# Patient Record
Sex: Female | Born: 1950 | Race: White | Hispanic: No | Marital: Married
Health system: Northeastern US, Academic
[De-identification: ages and names within clinical notes are randomized; demographics above are authoritative.]

---

## 2019-12-07 ENCOUNTER — Ambulatory Visit
Admit: 2019-12-07 | Payer: PRIVATE HEALTH INSURANCE | Attending: Student in an Organized Health Care Education/Training Program | Primary: Internal Medicine

## 2019-12-08 DIAGNOSIS — Z23 Encounter for immunization: Secondary | ICD-10-CM

## 2019-12-26 MED ORDER — AMLODIPINE 5 MG TABLET
5 | ORAL_TABLET | ORAL | 1 refills | 90.00000 days | Status: AC
Start: 2019-12-26 — End: 2020-04-17

## 2019-12-30 ENCOUNTER — Inpatient Hospital Stay: Admit: 2019-12-30 | Discharge: 2019-12-30 | Payer: PRIVATE HEALTH INSURANCE | Primary: Internal Medicine

## 2019-12-30 DIAGNOSIS — Z0000 Encounter for general adult medical examination without abnormal findings: Secondary | ICD-10-CM

## 2019-12-30 DIAGNOSIS — M810 Age-related osteoporosis without current pathological fracture: Secondary | ICD-10-CM

## 2019-12-30 LAB — COMPREHENSIVE METABOLIC PANEL
BKR A/G RATIO: 1.9 (ref 1.0–2.2)
BKR ALANINE AMINOTRANSFERASE (ALT): 20 U/L (ref 10–35)
BKR ALBUMIN: 4.6 g/dL (ref 3.6–4.9)
BKR ALKALINE PHOSPHATASE: 47 U/L (ref 9–122)
BKR ANION GAP: 10 (ref 7–17)
BKR ASPARTATE AMINOTRANSFERASE (AST): 24 U/L (ref 10–35)
BKR BILIRUBIN TOTAL: 0.4 mg/dL (ref <=1.2)
BKR BLOOD UREA NITROGEN: 11 mg/dL (ref 8–23)
BKR BUN / CREAT RATIO: 17.2 (ref 8.0–23.0)
BKR CALCIUM: 9.1 mg/dL (ref 8.8–10.2)
BKR CHLORIDE: 102 mmol/L (ref 98–107)
BKR CO2: 26 mmol/L (ref 20–30)
BKR CREATININE: 0.64 mg/dL (ref 0.40–1.30)
BKR EGFR (AFR AMER): >60 mL/min/{1.73_m2} (ref >60)
BKR EGFR (NON AFRICAN AMERICAN): >60 mL/min/{1.73_m2} (ref >60)
BKR GLOBULIN: 2.4 g/dL
BKR GLUCOSE: 96 mg/dL (ref 70–100)
BKR POTASSIUM: 3.9 mmol/L (ref 3.3–5.1)
BKR PROTEIN TOTAL: 7 g/dL (ref 6.6–8.7)
BKR SODIUM: 138 mmol/L (ref 136–144)

## 2019-12-30 LAB — URINALYSIS WITH CULTURE REFLEX      (BH LMW YH)
BKR BILIRUBIN, UA: NEGATIVE
BKR BLOOD, UA: NEGATIVE
BKR GLUCOSE, UA: NEGATIVE
BKR KETONES, UA: NEGATIVE
BKR LEUKOCYTE ESTERASE, UA: NEGATIVE
BKR NITRITE, UA: NEGATIVE
BKR PH, UA: 7 (ref 5.5–7.5)
BKR PROTEIN, UA: NEGATIVE
BKR SPECIFIC GRAVITY, UA: 1.007 (ref 1.005–1.030)
BKR UROBILINOGEN, UA: <2 EU/dL (ref <=2.0)

## 2019-12-30 LAB — CBC WITH AUTO DIFFERENTIAL
BKR WAM ABSOLUTE IMMATURE GRANULOCYTES: 0 x 1000/ÂµL (ref 0.0–0.4)
BKR WAM ABSOLUTE LYMPHOCYTE COUNT: 1.8 x 1000/ÂµL (ref 0.5–5.4)
BKR WAM ABSOLUTE NRBC: 0 x 1000/ÂµL
BKR WAM ANALYZER ANC: 3.6 x 1000/ÂµL (ref 2.2–7.2)
BKR WAM BASOPHIL ABSOLUTE COUNT: 0.1 x 1000/ÂµL (ref 0.0–0.2)
BKR WAM BASOPHILS: 0.9 % (ref 0.0–2.0)
BKR WAM EOSINOPHIL ABSOLUTE COUNT: 0 x 1000/ÂµL (ref 0.0–0.4)
BKR WAM EOSINOPHILS: 0.7 % (ref 0.0–4.0)
BKR WAM HEMATOCRIT: 42.3 % (ref 36.0–48.0)
BKR WAM HEMOGLOBIN: 13.7 g/dL (ref 12.0–15.0)
BKR WAM IMMATURE GRANULOCYTES: 0.2 % (ref 0.0–0.4)
BKR WAM LYMPHOCYTES: 31 % (ref 10.0–50.0)
BKR WAM MCH (PG): 31.5 pg (ref 25.0–35.0)
BKR WAM MCHC: 32.4 g/dL — ABNORMAL LOW (ref 33.0–37.0)
BKR WAM MCV: 97.2 fL (ref 81.0–99.0)
BKR WAM MONOCYTE ABSOLUTE COUNT: 0.3 x 1000/ÂµL (ref 0.1–1.2)
BKR WAM MONOCYTES: 5.2 % (ref 3.0–11.0)
BKR WAM MPV: 10.6 fL (ref 8.0–12.0)
BKR WAM NEUTROPHILS: 62 % (ref 45.0–90.0)
BKR WAM NUCLEATED RED BLOOD CELLS: 0 % (ref 0.0–0.0)
BKR WAM PLATELETS: 238 x1000/ÂµL (ref 120–450)
BKR WAM RDW-CV: 13 % (ref 11.5–14.5)
BKR WAM RED BLOOD CELL COUNT: 4.4 M/ÂµL (ref 3.5–5.5)
BKR WAM WHITE BLOOD CELL COUNT: 5.8 x1000/ÂµL (ref 4.8–10.8)

## 2019-12-30 LAB — TSH W/REFLEX TO FT4     (BH GH LMW Q YH): BKR THYROID STIMULATING HORMONE: 1.05 u[IU]/mL

## 2019-12-30 LAB — LIPID PANEL
BKR CHOLESTEROL/HDL RATIO: 2.9 (ref 0.0–5.0)
BKR CHOLESTEROL: 191 mg/dL
BKR HDL CHOLESTEROL: 67 mg/dL (ref >=40)
BKR LDL CHOLESTEROL CALCULATED: 111 mg/dL — ABNORMAL HIGH
BKR TRIGLYCERIDES: 64 mg/dL

## 2019-12-30 LAB — UA REFLEX CULTURE

## 2019-12-30 LAB — BILIRUBIN, DIRECT: BKR BILIRUBIN DIRECT: <0.2 mg/dL (ref <=0.3)

## 2019-12-31 LAB — HEMOGLOBIN A1C
BKR ESTIMATED AVERAGE GLUCOSE: 123 mg/dL
BKR HEMOGLOBIN A1C: 5.9 % — ABNORMAL HIGH (ref 4.0–5.6)

## 2019-12-31 LAB — VITAMIN D, 25-HYDROXY: BKR VITAMIN D 25-HYDROXY: 61.5 ng/mL (ref 30.0–100.0)

## 2020-01-04 ENCOUNTER — Ambulatory Visit: Admit: 2020-01-04 | Payer: PRIVATE HEALTH INSURANCE | Primary: Internal Medicine

## 2020-01-04 DIAGNOSIS — Z23 Encounter for immunization: Secondary | ICD-10-CM

## 2020-04-17 MED ORDER — AMLODIPINE 5 MG TABLET
5 | ORAL_TABLET | ORAL | 1 refills | 90.00000 days | Status: AC
Start: 2020-04-17 — End: 2020-07-13

## 2020-07-07 MED ORDER — OMEPRAZOLE 40 MG CAPSULE,DELAYED RELEASE
40 | ORAL_CAPSULE | Freq: Every day | ORAL | 4 refills | 90.00000 days | Status: AC
Start: 2020-07-07 — End: 2020-07-30

## 2020-07-13 MED ORDER — AMLODIPINE 5 MG TABLET
5 | ORAL_TABLET | ORAL | 2 refills | 90.00000 days | Status: AC
Start: 2020-07-13 — End: 2021-01-05

## 2020-07-30 MED ORDER — OMEPRAZOLE 40 MG CAPSULE,DELAYED RELEASE
40 | ORAL_CAPSULE | ORAL | 4 refills | 90.00000 days | Status: AC
Start: 2020-07-30 — End: 2021-01-11

## 2020-11-09 ENCOUNTER — Encounter
Admit: 2020-11-09 | Payer: PRIVATE HEALTH INSURANCE | Attending: Vascular and Interventional Radiology | Primary: Internal Medicine

## 2020-11-23 MED ORDER — ZOLEDRONIC ACID 5 MG/100 ML IN MANNITOL 5 %-WATER INTRAVENOUS PIGGYBCK
5 | Freq: Once | INTRAVENOUS | Status: CP
Start: 2020-11-23 — End: ?
  Administered 2020-11-23: 16:00:00 5 mL/h via INTRAVENOUS

## 2021-01-05 MED ORDER — AMLODIPINE 5 MG TABLET
5 | ORAL_TABLET | ORAL | 1 refills | 90.00000 days | Status: AC
Start: 2021-01-05 — End: 2021-04-02

## 2021-01-11 ENCOUNTER — Inpatient Hospital Stay: Admit: 2021-01-11 | Discharge: 2021-01-11 | Payer: PRIVATE HEALTH INSURANCE | Primary: Internal Medicine

## 2021-01-11 DIAGNOSIS — Z0000 Encounter for general adult medical examination without abnormal findings: Secondary | ICD-10-CM

## 2021-01-11 DIAGNOSIS — R7303 Prediabetes: Secondary | ICD-10-CM

## 2021-01-11 LAB — COMPREHENSIVE METABOLIC PANEL
BKR A/G RATIO: 2.3 — ABNORMAL HIGH (ref 1.0–2.2)
BKR ALANINE AMINOTRANSFERASE (ALT): 24 U/L (ref 10–35)
BKR ALBUMIN: 4.8 g/dL (ref 3.6–4.9)
BKR ALKALINE PHOSPHATASE: 61 U/L (ref 9–122)
BKR ANION GAP: 10 g/dL (ref 7–17)
BKR ASPARTATE AMINOTRANSFERASE (AST): 23 U/L (ref 10–35)
BKR AST/ALT RATIO: 1 x 1000/??L (ref 0.00–1.00)
BKR BILIRUBIN TOTAL: 0.2 mg/dL (ref <=1.2)
BKR BLOOD UREA NITROGEN: 17 mg/dL (ref 8–23)
BKR BUN / CREAT RATIO: 24.6 — ABNORMAL HIGH (ref 8.0–23.0)
BKR CALCIUM: 9.4 mg/dL (ref 8.8–10.2)
BKR CHLORIDE: 104 mmol/L (ref 98–107)
BKR CO2: 27 mmol/L (ref 20–30)
BKR COLOR, UA: 98 mg/dL (ref 70–100)
BKR CREATININE: 0.69 mg/dL (ref 0.40–1.30)
BKR EGFR (AFR AMER): >60 mL/min/{1.73_m2} (ref >60)
BKR EGFR (NON AFRICAN AMERICAN): >60 mL/min/{1.73_m2} (ref >60)
BKR GLOBULIN: 2.1 g/dL (ref 0.60–3.70)
BKR GLUCOSE: 98 mg/dL (ref 70–100)
BKR POTASSIUM: 4.5 mmol/L (ref 3.3–5.3)
BKR PROTEIN TOTAL: 6.9 g/dL (ref 6.6–8.7)
BKR SODIUM: 141 mmol/L (ref 136–144)

## 2021-01-11 LAB — CBC WITH AUTO DIFFERENTIAL
BKR WAM ABSOLUTE IMMATURE GRANULOCYTES.: 0.01 x 1000/??L (ref 0.00–0.30)
BKR WAM ABSOLUTE LYMPHOCYTE COUNT.: 1.46 x 1000/??L (ref 0.60–3.70)
BKR WAM ABSOLUTE NRBC (2 DEC): 0 x 1000/??L (ref 0.00–1.00)
BKR WAM ANALYZER ANC: 5.5 x 1000/??L (ref 2.00–7.60)
BKR WAM BASOPHIL ABSOLUTE COUNT.: 0.05 x 1000/??L (ref 0.00–1.00)
BKR WAM BASOPHILS: 0.7 % (ref 0.0–1.4)
BKR WAM EOSINOPHIL ABSOLUTE COUNT.: 0.11 x 1000/??L (ref 0.00–1.00)
BKR WAM EOSINOPHILS: 1.5 % (ref 0.0–5.0)
BKR WAM HEMATOCRIT (2 DEC): 44.4 % (ref 35.00–45.00)
BKR WAM HEMOGLOBIN: 14.2 g/dL (ref 11.7–15.5)
BKR WAM IMMATURE GRANULOCYTES: 0.1 % (ref 0.0–1.0)
BKR WAM LYMPHOCYTES: 19.4 % — ABNORMAL HIGH (ref 17.0–50.0)
BKR WAM MCH (PG): 30.5 pg (ref 27.0–33.0)
BKR WAM MCHC: 32 g/dL (ref 31.0–36.0)
BKR WAM MCV: 95.5 fL (ref 80.0–100.0)
BKR WAM MONOCYTE ABSOLUTE COUNT.: 0.38 x 1000/??L — ABNORMAL HIGH (ref 0.00–1.00)
BKR WAM MONOCYTES: 5.1 % (ref 4.0–12.0)
BKR WAM MPV: 10.5 fL (ref 8.0–12.0)
BKR WAM NEUTROPHILS: 73.2 % — ABNORMAL HIGH (ref 39.0–72.0)
BKR WAM NUCLEATED RED BLOOD CELLS: 0 % (ref 0.0–1.0)
BKR WAM PLATELETS: 259 x1000/??L (ref 150–420)
BKR WAM RDW-CV: 13.2 % (ref 11.0–15.0)
BKR WAM RED BLOOD CELL COUNT.: 4.65 M/??L (ref 4.00–6.00)
BKR WAM WHITE BLOOD CELL COUNT: 7.5 x1000/??L (ref 4.0–11.0)

## 2021-01-11 LAB — URINE MICROSCOPIC     (BH GH LMW YH)
BKR HYALINE CASTS, UA INSTRUMENT (NUMERIC): 0 /LPF (ref 0–3)
BKR RBC/HPF INSTRUMENT: 1 /HPF (ref 0–2)
BKR WBC/HPF INSTRUMENT: 0 /HPF (ref 0–5)

## 2021-01-11 LAB — URINALYSIS WITH CULTURE REFLEX      (BH LMW YH)
BKR BILIRUBIN DIRECT: 1.009 mg/dL (ref 1.005–1.030)
BKR BILIRUBIN, UA: NEGATIVE
BKR GLUCOSE, UA: NEGATIVE
BKR KETONES, UA: NEGATIVE
BKR LEUKOCYTE ESTERASE, UA: NEGATIVE
BKR NITRITE, UA: NEGATIVE
BKR PH, UA: 6 mg/dL (ref 5.5–7.5)
BKR PROTEIN, UA: NEGATIVE
BKR SPECIFIC GRAVITY, UA: 1.009 (ref 1.005–1.030)
BKR UROBILINOGEN, UA: <2 EU/dL (ref <=2.0)

## 2021-01-11 LAB — UA REFLEX CULTURE

## 2021-01-11 LAB — LIPID PANEL
BKR CHOLESTEROL/HDL RATIO: 3.4 (ref 0.0–5.0)
BKR CHOLESTEROL: 212 mg/dL — ABNORMAL HIGH
BKR HDL CHOLESTEROL: 62 mg/dL (ref >=40)
BKR LDL CHOLESTEROL CALCULATED: 127 mg/dL — ABNORMAL HIGH
BKR TRIGLYCERIDES: 113 mg/dL

## 2021-01-11 LAB — TSH W/REFLEX TO FT4     (BH GH LMW Q YH): BKR THYROID STIMULATING HORMONE: 1.35 ??IU/mL

## 2021-01-12 LAB — HEMOGLOBIN A1C
BKR ESTIMATED AVERAGE GLUCOSE: 123 mg/dL
BKR HEMOGLOBIN A1C: 5.9 % — ABNORMAL HIGH (ref 4.0–5.6)

## 2021-04-02 MED ORDER — AMLODIPINE 5 MG TABLET
5 | ORAL_TABLET | ORAL | 2 refills | 90.00000 days | Status: AC
Start: 2021-04-02 — End: 2021-09-29

## 2021-05-28 IMAGING — CT TC TORAX
2 series · 11 of 33 positions shown, 12 images · non-contrast
Comparison: none

[Series 3: mediastino body 2.0 · axial · 0.78mm/px · z∈[-993,-716]mm · 8 of 331 slices shown]
[im 27/331  mediastinal]
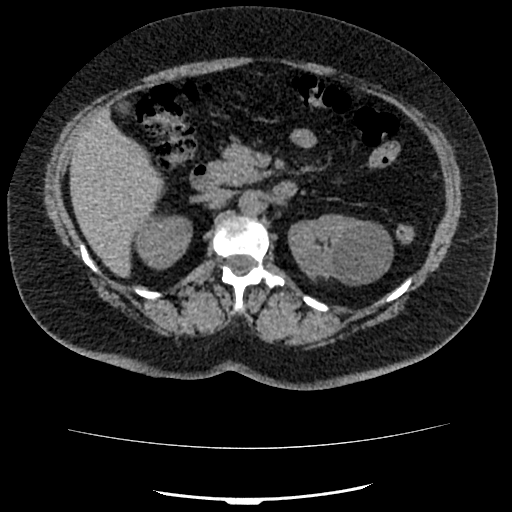
[im 80/331  mediastinal]
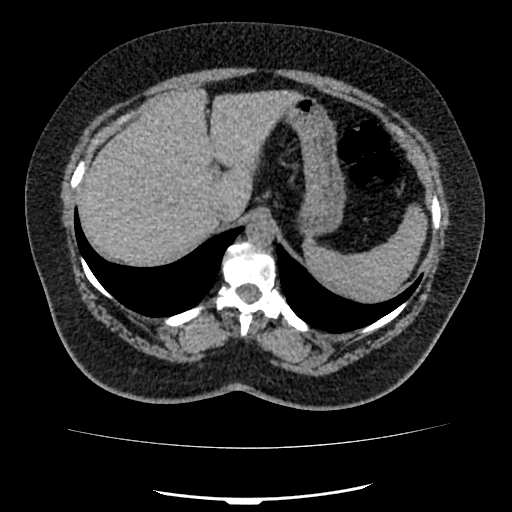
[im 106/331  mediastinal]
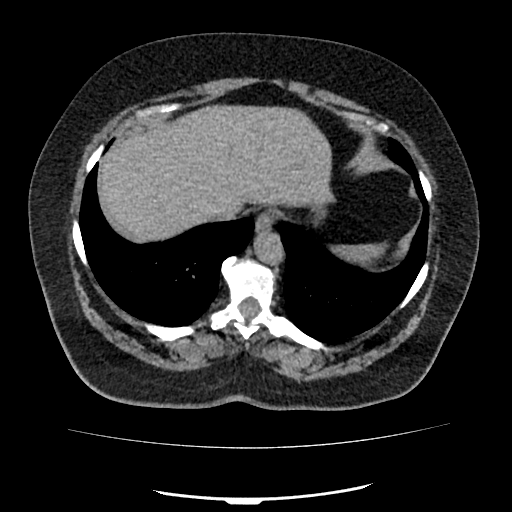
[im 159/331  mediastinal]
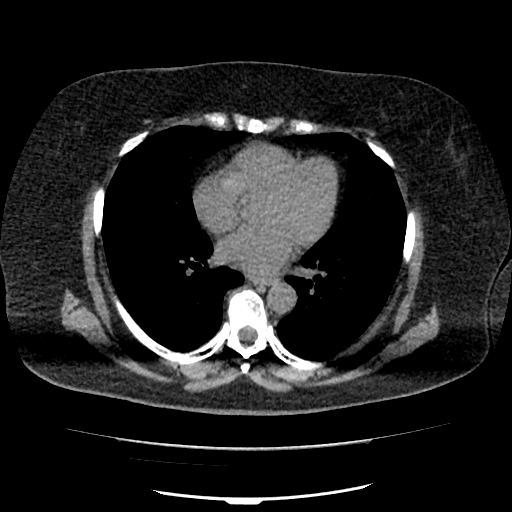
[im 174/331  mediastinal]
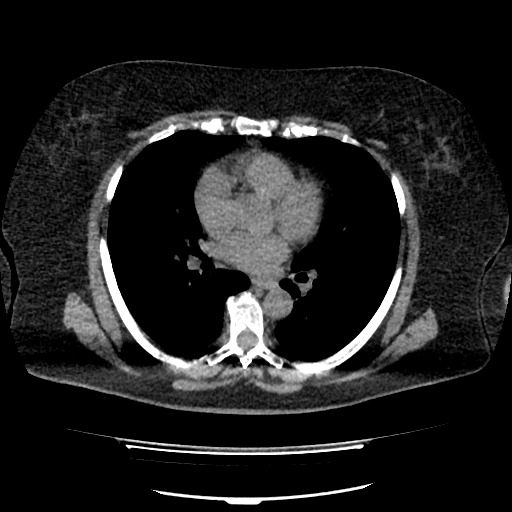
[im 225/331  mediastinal]
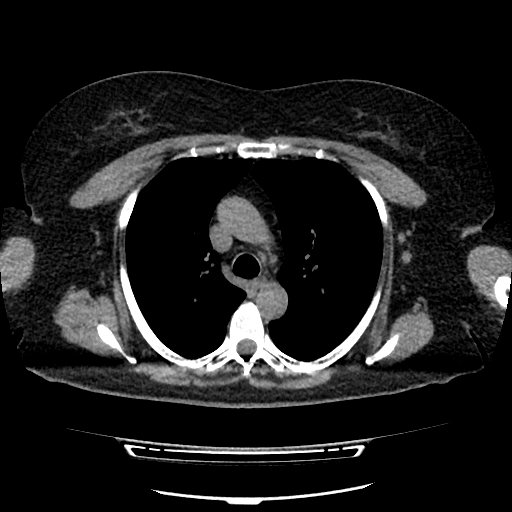
[im 251/331  mediastinal]
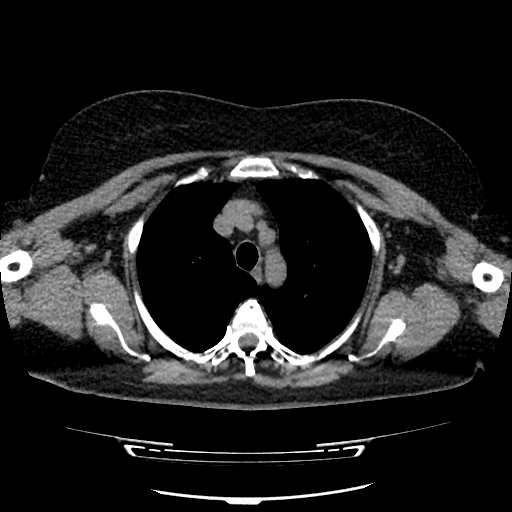
[im 304/331  mediastinal]
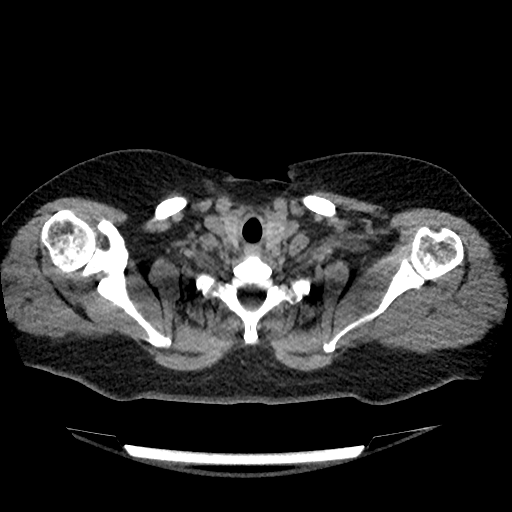

[Series 5: mediastino body 0.784 · axial · 0.78mm/px · z∈[-997,-654]mm · 3 of 21 slices shown, 4 images]
[im 1/21  mediastinal]
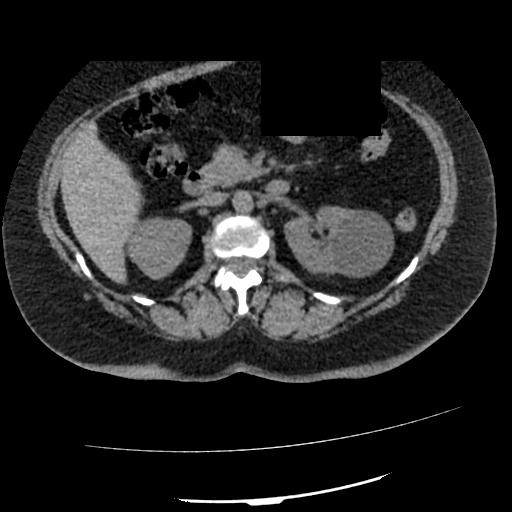
[im 1/21  lung]
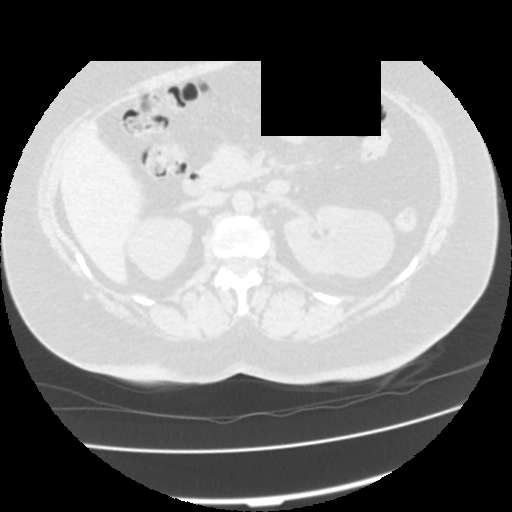
[im 12/21  lung]
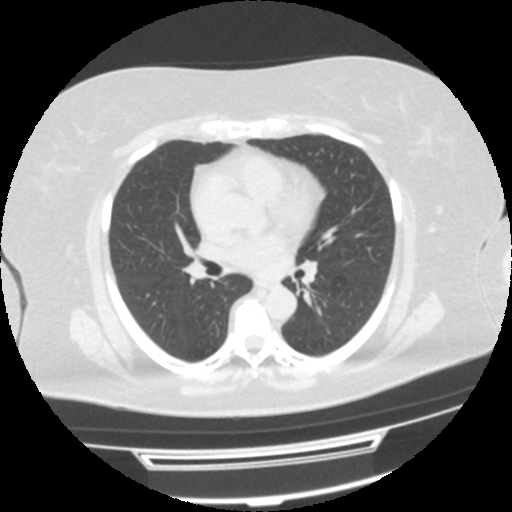
[im 21/21  lung]
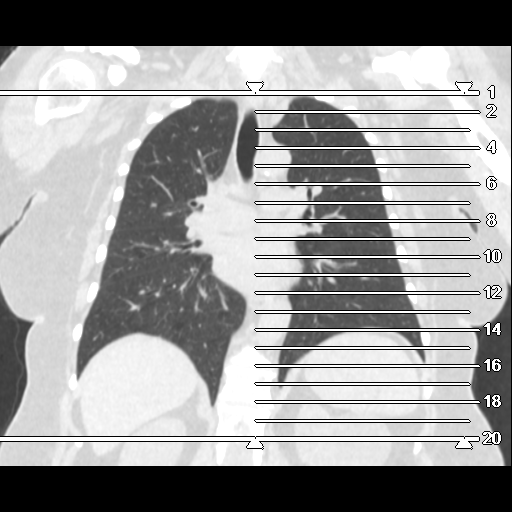

[11 of 33 positions shown; findings below may reference images not displayed]

Técnica:
Aquisição volumétrica com reconstruções multiplanares.

Relatório:
Bandas parenquimatosas basais de aspecto fibroatelectásico.
TOMOGRAFIA COMPUTADORIZADA DO TÓRAX
Restante do parênquima pulmonar com atenuação preservada.
Traqueia e brônquios principais patentes, de calibres normais.
Estruturas vasculares mediastinais com calibre preservado.
Linfonodos calcificados hilares à esquerda, de aspecto sequelar.
Linfonodomegalia subcarinal, medindo 1,3 cm, de aspecto inespecífico.
Ausência de derrame pleural.
Alterações degenerativas na coluna torácica.

Nódulo hipoatenuante no terço inferior do lobo esquerdo da tireoide, medindo 2,3 cm, de aspecto 
inespecífico. Correlacionar com estudo ecográfico.

Impressão:
Bandas parenquimatosas basais de aspecto fibroatelectásico.
Linfonodos calcificados hilares à esquerda, de aspecto sequelar.
Linfonodomegalia subcarinal, de aspecto inespecífico.
Alterações degenerativas na coluna torácica.
Nódulo hipoatenuante  no  terço  inferior  do  lobo  esquerdo  da  tireoide,  de  aspecto  inespecífico. 
Correlacionar com estudo ecográfico.

## 2021-09-29 MED ORDER — AMLODIPINE 5 MG TABLET
5 | ORAL_TABLET | ORAL | 2 refills | 90.00000 days | Status: AC
Start: 2021-09-29 — End: 2022-05-12

## 2021-10-01 ENCOUNTER — Emergency Department: Admit: 2021-10-01 | Payer: PRIVATE HEALTH INSURANCE | Primary: Internal Medicine

## 2021-10-01 ENCOUNTER — Ambulatory Visit: Admit: 2021-10-01 | Payer: PRIVATE HEALTH INSURANCE | Primary: Internal Medicine

## 2021-10-01 ENCOUNTER — Inpatient Hospital Stay: Admit: 2021-10-01 | Discharge: 2021-10-02 | Payer: PRIVATE HEALTH INSURANCE | Primary: Internal Medicine

## 2021-10-01 ENCOUNTER — Encounter: Admit: 2021-10-01 | Payer: PRIVATE HEALTH INSURANCE | Primary: Internal Medicine

## 2021-10-01 DIAGNOSIS — R110 Nausea: Secondary | ICD-10-CM

## 2021-10-01 DIAGNOSIS — H538 Other visual disturbances: Secondary | ICD-10-CM

## 2021-10-01 DIAGNOSIS — R42 Dizziness and giddiness: Principal | ICD-10-CM

## 2021-10-01 LAB — COMPREHENSIVE METABOLIC PANEL
BKR A/G RATIO: 2 (ref 1.0–2.2)
BKR ALANINE AMINOTRANSFERASE (ALT): 19 U/L (ref 10–35)
BKR ALBUMIN: 4.2 g/dL (ref 3.6–4.9)
BKR ALKALINE PHOSPHATASE: 48 U/L (ref 9–122)
BKR ANION GAP: 9 (ref 7–17)
BKR ASPARTATE AMINOTRANSFERASE (AST): 18 U/L (ref 10–35)
BKR AST/ALT RATIO: 0.9
BKR BILIRUBIN TOTAL: 0.4 mg/dL (ref <=1.2)
BKR BLOOD UREA NITROGEN: 22 mg/dL (ref 8–23)
BKR BUN / CREAT RATIO: 28.6 — ABNORMAL HIGH (ref 8.0–23.0)
BKR CALCIUM: 9.4 mg/dL (ref 8.8–10.2)
BKR CHLORIDE: 96 mmol/L — ABNORMAL LOW (ref 98–107)
BKR CO2: 28 mmol/L (ref 20–30)
BKR CREATININE: 0.77 mg/dL (ref 0.40–1.30)
BKR EGFR, CREATININE (CKD-EPI 2021): >60 mL/min/{1.73_m2} (ref >=60)
BKR GLOBULIN: 2.1 g/dL — ABNORMAL LOW (ref 2.3–3.5)
BKR GLUCOSE: 163 mg/dL — ABNORMAL HIGH (ref 70–100)
BKR POTASSIUM: 3.6 mmol/L (ref 3.3–5.3)
BKR PROTEIN TOTAL: 6.3 g/dL — ABNORMAL LOW (ref 6.6–8.7)
BKR SODIUM: 133 mmol/L — ABNORMAL LOW (ref 136–144)

## 2021-10-01 LAB — CBC WITHOUT DIFFERENTIAL
BKR WAM ANALYZER ANC: 4.59 x 1000/ÂµL (ref 2.00–7.60)
BKR WAM HEMATOCRIT (2 DEC): 38.8 % (ref 35.00–45.00)
BKR WAM HEMOGLOBIN: 12.8 g/dL (ref 11.7–15.5)
BKR WAM MCH (PG): 31.4 pg (ref 27.0–33.0)
BKR WAM MCHC: 33 g/dL (ref 31.0–36.0)
BKR WAM MCV: 95.1 fL (ref 80.0–100.0)
BKR WAM MPV: 10.7 fL (ref 8.0–12.0)
BKR WAM PLATELETS: 215 x1000/ÂµL (ref 150–420)
BKR WAM RDW-CV: 12.9 % (ref 11.0–15.0)
BKR WAM RED BLOOD CELL COUNT.: 4.08 M/ÂµL (ref 4.00–6.00)
BKR WAM WHITE BLOOD CELL COUNT: 6.2 x1000/ÂµL (ref 4.0–11.0)

## 2021-10-01 LAB — INFLUENZA A+B/RSV BY RT-PCR
BKR INFLUENZA A: NEGATIVE
BKR INFLUENZA B: NEGATIVE
BKR RESPIRATORY SYNCYTIAL VIRUS: NEGATIVE

## 2021-10-01 LAB — PROTIME AND INR
BKR INR: 1.05 (ref 0.92–1.08)
BKR PROTHROMBIN TIME: 10.9 s (ref 9.5–12.1)

## 2021-10-01 LAB — TROPONIN T HIGH SENSITIVITY, 1 HOUR WITH REFLEX (BH GH LMW YH)
BKR TROPONIN T HS 1 HOUR DELTA FROM 0 HOUR: 1 ng/L
BKR TROPONIN T HS 1 HOUR: 7 ng/L

## 2021-10-01 LAB — MAGNESIUM: BKR MAGNESIUM: 1.6 mg/dL — ABNORMAL LOW (ref 1.7–2.4)

## 2021-10-01 LAB — PARTIAL THROMBOPLASTIN TIME     (BH GH LMW Q YH): BKR PARTIAL THROMBOPLASTIN TIME: 21.8 s — ABNORMAL LOW (ref 23.0–32.1)

## 2021-10-01 LAB — TROPONIN T HIGH SENSITIVITY, 0 HOUR BASELINE WITH REFLEX (BH GH LMW YH): BKR TROPONIN T HS 0 HOUR BASELINE: <6 ng/L

## 2021-10-01 LAB — TSH W/REFLEX TO FT4     (BH GH LMW Q YH): BKR THYROID STIMULATING HORMONE: 0.623 u[IU]/mL

## 2021-10-01 LAB — SARS COV-2 (COVID-19) RNA: BKR SARS-COV-2 RNA (COVID-19) (YH): NEGATIVE

## 2021-10-01 MED ORDER — AMLODIPINE 5 MG TABLET
5 mg | Freq: Every day | ORAL | Status: DC
Start: 2021-10-01 — End: 2021-10-02
  Administered 2021-10-02: 15:00:00 5 mg via ORAL

## 2021-10-01 MED ORDER — ONDANSETRON HCL (PF) 4 MG/2 ML INJECTION SOLUTION
4 mg/2 mL | Freq: Once | INTRAVENOUS | Status: CP
Start: 2021-10-01 — End: ?
  Administered 2021-10-01: 20:00:00 4 mL via INTRAVENOUS

## 2021-10-01 MED ORDER — SODIUM CHLORIDE 0.9 % (FLUSH) INJECTION SYRINGE
0.9 % | INTRAVENOUS | Status: DC | PRN
Start: 2021-10-01 — End: 2021-10-02

## 2021-10-01 MED ORDER — ROSUVASTATIN 40 MG TABLET
40 mg | Freq: Every day | ORAL | Status: DC
Start: 2021-10-01 — End: 2021-10-02
  Administered 2021-10-02: 15:00:00 40 mg via ORAL

## 2021-10-01 MED ORDER — HEPARIN (PORCINE) 5,000 UNIT/ML INJECTION SOLUTION
5000 unit/mL | Freq: Two times a day (BID) | SUBCUTANEOUS | Status: DC
Start: 2021-10-01 — End: 2021-10-02
  Administered 2021-10-02: 03:00:00 via SUBCUTANEOUS

## 2021-10-01 MED ORDER — IOHEXOL 350 MG IODINE/ML INTRAVENOUS SOLUTION
350 mg iodine/mL | Freq: Once | INTRAVENOUS | Status: CP | PRN
Start: 2021-10-01 — End: ?
  Administered 2021-10-01: 20:00:00 350 mL via INTRAVENOUS

## 2021-10-01 MED ORDER — SODIUM CHLORIDE 0.9 % (FLUSH) INJECTION SYRINGE
0.9 % | Freq: Three times a day (TID) | INTRAVENOUS | Status: DC
Start: 2021-10-01 — End: 2021-10-02
  Administered 2021-10-02 (×2): 0.9 mL via INTRAVENOUS

## 2021-10-01 NOTE — ED Notes
6:02 PM Assisting primary RN with care. MRI screener completed.

## 2021-10-01 NOTE — ED Notes
63:70 PM 70 year old female arrived from home c/o +stroke alert activated @ 2:21 PM from PIT, as +vertigo, +dizziness, +dysphagia, and blurry vision bilaterally. Last known well: 12PM today. Pt received IVF and Reglan prior to stroke activation per [facility] as pt was in PIT. Pt now awake and alert. Interior head completed. Pt moved to Trauma 4. MD Cronsell eval'd pt. IV in place to [facility], bloodwork completed and sent to lab per orders. SBP 140s, HR 80s NSR. Sats room air 99%. Blood glucose: 137. Denies chest pain, dyspnea or distress at this time. Telestroke MD [facility] eval'd pt, Bailey head negative, deficits are improving, NIH now 0. No need for Tenectaplase at this time. IV Zofran administered per [facility] for +nausea, improved s/p Zofran. Pt resting comfortably at this time. Awaiting ready room to move out of Trauma at this time. Spouse at bedside, updated on plan of care. Past Medical History: Diagnosis Date ? H/O colonoscopy 02/19/2018  70yr recall / serrated polyp ? History of peptic ulcer  ? Hypertension   EF > 55% 3/16 ? Osteoporosis   Dr. Yancey Flemings; 2/20 T -2.9; T -1.7 11/17; T -2.1 11/15 ? Prediabetes  ? Thyroid nodule   Dr. Yancey Flemings

## 2021-10-01 NOTE — H&P
[facility] H&PHistory provided by: the patientSubjective CC: weird sensationHPI: 70 y.o. female PMH of HTN, peptic ulcer who presents with abnormal sensation. Patient states around 12PM today she had sudden onset dizziness with word finding difficulties. Her symptoms did not resolve until she came to the hospital. Patient denies any similar events previously.ROS is otherwise negative for fever/ chills/ headache/ vision-change/ focal-weakness/ chest-pain/ dyspnea/ palpitations/ cough/ abd-pain/ nausea/ vomiting/ diarrhea/ constipation/ dysuria/ hematuria/ frequency, rash/ myalgias/ arthralgiasED: CTH, CTAObjective Medical History PMH PSH Past Medical History: Diagnosis Date ? H/O colonoscopy 02/19/2018  54yr recall / serrated polyp ? History of peptic ulcer  ? Hypertension   EF > 55% 3/16 ? Osteoporosis   Dr. Yancey Flemings; 2/20 T -2.9; T -1.7 11/17; T -2.1 11/15 ? Prediabetes  ? Thyroid nodule   Dr. Yancey Flemings  Past Surgical History: Procedure Laterality Date ? Arthroscopic surgery of the right knee   ? CATARACT EXTRACTION   ? Cesarean section (x1)   ? History of thyroid biopsy (had an FNA biopsy in 2008 and 2011, unremarkable)   ? SHOULDER SURGERY Right  ? TONSILLECTOMY    Social History Family History Social History Socioeconomic History ? Marital status: Married   Spouse name: Not on file ? Number of children: Not on file ? Years of education: Not on file ? Highest education level: Not on file Occupational History ? Not on file Tobacco Use ? Smoking status: Never ? Smokeless tobacco: Never Substance and Sexual Activity ? Alcohol use: Yes   Comment: occasional glass of wine ? Drug use: No ? Sexual activity: Not on file Other Topics Concern ? Not on file Social History Narrative ? Not on file Social Determinants of Health Financial Resource Strain: Not on file Food Insecurity: Not on file Transportation Needs: Not on file Physical Activity: Not on file Stress: Not on file Social Connections: Not on file Intimate Partner Violence: Not on file Housing Stability: Not on file  Family History Problem Relation Age of Onset ? Osteoporosis Mother  ? Other (data conversion) Cousin       Siblings (4) history of hyperlipidemia 1 brother with MS/daughter with MS/Family history of diabetes mellitus Sister with DM/Family history of hyperlipidemia strong family history/Father health status was reviewed died at 28, MI/Mother health status was reviewed Living with osteoporosis and hypercholesterolemia ? Coronary Artery Disease Father  ? Coronary Artery Disease Brother  ? Hemochromatosis Brother  ? Diabetes Sister   Prior to Admission Medications (Not in a hospital admission)  Allergies Allergies Allergen Reactions ? Vicodin [Hydrocodone-Acetaminophen] Syncope  Review of Systems Review of Systems: as per HPIObjective Physical Exam Vitals: BP 130/61  - Pulse 86  - Temp 97.2 ?F (36.2 ?C) (Temporal)  - Resp 19  - Wt 63.7 kg  - SpO2 98%  - BMI 28.36 kg/m? General: NADHEENT: NC/AT, PERRLACardiac: RRR, no M/R/G, S1S2Pulmonary: BLAE, no W/C/RAbdomen: soft, NT/ND, BS+, no rebound/guardingExtremities: symmetric, no edema, DP +2 b/lNeurologic: AAOx3, CN II-XII grossly intact, moving all 4 extremities, sensation intact, reflexes symmetricMSK: normal ROM, no bruising, no erythema, non tenderSkin: no rash or lesionsPertinent Labs/Diagnostics LabsRecent Results (from the past 24 hour(s)) EKG  Collection Time: 10/01/21  2:16 PM Result Value Ref Range  Heart Rate 90 bpm  QRS Interval 80 ms  QT Interval 394 ms  QTC Interval 481 ms  P Axis 67 deg  QRS Axis 19 deg  T Wave Axis 52 deg  P-R Interval 156 msec  SEVERITY Abnormal ECG severity Mag  Collection Time: 10/01/21 2:53 PM  Result Value Ref Range  Magnesium 1.6 (L) 1.7 - 2.4 mg/dL Comprehensive metabolic panel  Collection Time: 10/01/21  2:53 PM Result Value Ref Range  Sodium 133 (L) 136 - 144 mmol/L  Potassium 3.6 3.3 - 5.3 mmol/L  Chloride 96 (L) 98 - 107 mmol/L  CO2 28 20 - 30 mmol/L  Anion Gap 9 7 - 17  Glucose 163 (H) 70 - 100 mg/dL  BUN 22 8 - 23 mg/dL  Creatinine 7.84 6.96 - 1.30 mg/dL  Calcium 9.4 8.8 - 29.5 mg/dL  BUN/Creatinine Ratio 28.4 (H) 8.0 - 23.0  Total Protein 6.3 (L) 6.6 - 8.7 g/dL  Albumin 4.2 3.6 - 4.9 g/dL  Total Bilirubin 0.4 <=1.3 mg/dL  Alkaline Phosphatase 48 9 - 122 U/L  Alanine Aminotransferase (ALT) 19 10 - 35 U/L  Aspartate Aminotransferase (AST) 18 10 - 35 U/L  Globulin 2.1 (L) 2.3 - 3.5 g/dL  A/G Ratio 2.0 1.0 - 2.2  AST/ALT Ratio 0.9 See Comment  eGFR (Creatinine) >60 >=60 mL/min/1.18m2 CBC without differential  Collection Time: 10/01/21  2:53 PM Result Value Ref Range  WBC 6.2 4.0 - 11.0 x1000/?L  RBC 4.08 4.00 - 6.00 M/?L  Hemoglobin 12.8 11.7 - 15.5 g/dL  Hematocrit 24.40 10.27 - 45.00 %  MCV 95.1 80.0 - 100.0 fL  MCH 31.4 27.0 - 33.0 pg  MCHC 33.0 31.0 - 36.0 g/dL  RDW-CV 25.3 66.4 - 40.3 %  Platelets 215 150 - 420 x1000/?L  MPV 10.7 8.0 - 12.0 fL  ANC(Abs Neutrophil Count) 4.59 2.00 - 7.60 x 1000/?L Protime-INR  Collection Time: 10/01/21  2:53 PM Result Value Ref Range  Prothrombin Time 10.9 9.5 - 12.1 seconds  INR 1.05 0.92 - 1.08 Partial thromboplastin time  Collection Time: 10/01/21  2:53 PM Result Value Ref Range  PTT 21.8 (L) 23.0 - 32.1 seconds Type and screen  Collection Time: 10/01/21  2:53 PM Result Value Ref Range  ABO Grouping A   Rh Type POS   Antibody Screen NEG  Troponin T High Sensitivity, Emergency; 0 hour baseline AND 1 hour with reflex (3 hour)  Collection Time: 10/01/21  2:53 PM Result Value Ref Range  High Sensitivity Troponin T <6 See Comment ng/L SARS CoV-2 (COVID-19) RNA-[facility])  Collection Time: 10/01/21  2:53 PM  Specimen: Nasopharynx; Viral Result Value Ref Range  SARS-CoV-2 RNA (COVID-19)  Negative Negative Influenza A+B/RSV by RT-PCR ([facility])  Collection Time: 10/01/21  2:53 PM  Specimen: Nasopharynx; Viral Result Value Ref Range  Influenza A Negative Negative  Influenza B Negative Negative  Respiratory Syncytial Virus Negative Negative TSH w/reflex to FT4  Collection Time: 10/01/21  2:53 PM Result Value Ref Range  Thyroid Stimulating Hormone 0.623 See Comment ?IU/mL Troponin T High Sensitivity, 1 Hour With Reflex ([facility])  Collection Time: 10/01/21  3:57 PM Result Value Ref Range  High Sensitivity Troponin T 7 See Comment ng/L  1 hour Delta from 0 Hour, HS-Troponin T 1 ng/L ImagingCTA Head Neck Stroke Code W IV ContrastResult Date: 12/16/2022HISTORY:  Neuro deficit, acute, stroke suspected TECHNIQUE:  Routine HISTORY:  Neuro deficit, acute, stroke suspected TECHNIQUE:  Routine CTA head and neck. Reconstructions:  From the original axial multidetector data set, 3D images were created on an independent Vis[employer] workstation.  Iterative reconstructions was utilized to minimize dose. Contrast: 50 cc Omnipaque 350. COMPARISON:  None FINDINGS: CTA Head VASCULAR Anatomy:  No unusual variant or anomalous anatomy. Stenotic/Occlusive Disease: Calcific atherosclerosis  of the cavernous internal carotid arteries without hemodynamically significant stenosis Aneurysms:  None. Vascular Malformations:  None. CTA Neck Right Carotid:  No hemodynamically significant stenosis. Left Carotid:  No hemodynamically significant stenosis. Vertebrobasilar System:  Antegrade flow both vertebral arteries. Subclavian Arteries:  Unremarkable. Arch:  Unremarkable. Nonvascular Structures:  Enlarged thyroid gland with heterogenous nodule predominantly in the isthmus and left lobe measuring approximately 3.7 x 3.2 cm. Stenosis ranges are derived utilizing NASCET criteria.  No high-grade stenosis or occlusion of the cervical or large intracranial vasculature. Large left and isthmus thyroid nodule, by report this was previously biopsied in 2011. Above findings were communicated to and acknowledged by Cronsell,Jennifer,MD at 10/01/2021 2:56 PM. Reported and signed by:  Aliene Altes, MD [place] Head Stroke Code WO IV ContrastResult Date: 12/16/2022CLINICAL INFORMATION: Neuro deficit, acute, stroke suspected TECHNIQUE: [place] Brain without contrast.  Protocol: Routine.  Iterative reconstruction was utilized to minimize dose. COMPARISON:  None FINDINGS: Brain Parenchyma: No hemorrhage or acute territorial infarct. Small cortical calcification in left lateral frontal lobe, could be related to prior infection. Extra Axial Spaces:  There is a partially empty sella turcica. This is usually an incidental finding, although it may rarely be an indicator of intracranial hypertension. Ventricular System:  Normal size and configuration. Visualized Paranasal Sinuses/Mastoids:  Bilateral sphenoid sinuses mucosal thickening. Bone:  No acute or suspicious abnormalities.  No parenchymal hemorrhage or acute territorial infarct. Partially empty sella turcica, incidental finding, although it may rarely be an indicator of intracranial hypertension Above findings were communicated to and acknowledged by Joelyn Oms, MD at 10/01/2021 2:48 PM. Reported and signed by:  Aliene Altes, MD [facility] for orders placed or performed during the hospital encounter of 10/01/21 EKG Result Value Ref Range  Heart Rate 90 bpm  QRS Interval 80 ms  QT Interval 394 ms  QTC Interval 481 ms  P Axis 67 deg  QRS Axis 19 deg  T Wave Axis 52 deg  P-R Interval 156 msec  SEVERITY Abnormal ECG severity  Assessment/Plan 70 y.o. female PMH of HTN, peptic ulcer who presents with abnormal sensation.# Dysarthria# Dizziness-trop negative, CTH and CTA negative for actionable findings, incidental partial empty sella-PT/OT, MRI brain, neurochecks-cw norvas-teleDiet: regularDVT Prophylaxis: lovenox SQFull ACLSNotifications PCP: Marjean Donna    Plan discussed with patient and/or family. YesSignedDecember 16, 2022 5:22 PMAng Chipper Herb, DOHospitalist at Anthem HospitalMHB [phone]

## 2021-10-01 NOTE — Progress Notes
Stroke Nurse Navigator Note - Stroke CodePatientSkilynn Durney PereiraMRN: [id number] DOB: 1952/11/05Presenting symptoms: Dizziness (Sudden onset nausea and dizziness. Denies CP or sob. ) and Stroke Code (Dizziness onset 12pm, persistent, blurry vision)Admission diagnosis: No admission diagnoses are documented for this encounter. Medical History: Past Medical History: Diagnosis Date ? H/O colonoscopy 02/19/2018  64yr recall / serrated polyp ? History of peptic ulcer  ? Hypertension   EF > 55% 3/16 ? Osteoporosis   Dr. Yancey Flemings; 2/20 T -2.9; T -1.7 11/17; T -2.1 11/15 ? Prediabetes  ? Thyroid nodule   Dr. Yancey Flemings Preferred language: PortugueseEmergency contact:  Primary Emergency Contact: Kaestner,Agusto, Home Phone: [phone] Last Documented Assessment 1a. LOC 0-->Alert, keenly responsive (10/01/21 1458 : Dolores Patty, RN) 1b. LOC Questions 0-->Answers both questions correctly (10/01/21 1458 : Dolores Patty, RN) 1c. LOC Commands 0-->Performs both tasks correctly (10/01/21 1458 : Dolores Patty, RN) 2. Gaze 0-->Normal (10/01/21 1458 : Dolores Patty, RN) 3. Visual Fields 0-->No visual loss (10/01/21 1458 : Dolores Patty, RN) 4. Facial 0-->Normal symmetrical movements (10/01/21 1458 : Dolores Patty, RN) 5a. Motor LUE 0-->No drift, limb holds 90 (or 45) degrees for full 10 secs (10/01/21 1458 : Dolores Patty, RN) 5b. Motor RUE 0-->No drift, limb holds 90 (or 45) degrees for full 10 secs (10/01/21 1458 : Dolores Patty, RN) 6a. Motor LLE 0-->No drift, leg holds 30 degree position for full 5 secs (10/01/21 1458 : Dolores Patty, RN) 6b. Motor RLE 0-->No drift, leg holds 30 degree position for full 5 secs (10/01/21 1458 : Dolores Patty, RN) 7. Limb Ataxia 0-->Absent (10/01/21 1458 : Dolores Patty, RN) 8. Sensory 0-->Normal, no sensory loss (10/01/21 1458 : Dolores Patty, RN) 9. Best Language 0-->No aphasia, normal (10/01/21 1458 : Dolores Patty, RN) 10. Dysarthria 0-->Normal (10/01/21 1458 : Dolores Patty, RN) 11. Extinction/Inattention 0-->No abnormality (10/01/21 1458 : Dolores Patty, RN) Total 0 (10/01/21 1458 : Dolores Patty, RN) 3oz Swallow Eval: (S) PASS -uninterrupted drinking and without coughing/choking during or immediately after drinking (10/01/21 1508 : Dolores Patty, RN)Finger stick: 137Stroke Code Note:  Stroke Code initiated @ 1420CC: vertigo, blurred vision, left arm drift.Patient from triage to ED [place].  Evaluated by Dr. Loney Loh.  [place]/CTA completed patient moved to Trauma 4 for further evaluation. Patient from home via EMS. Patient CC of vertigo, nausea, started @ 12 noon today. Arrived to ED went for EKG and evaluation in front triage. Then to [place] following ED MD evaluation. No focal deficit noted on exam.  See NIH flow sheet for details.Tele Stroke. Dr. Corrinne Eagle online for exam. SNN will continue to follow the patient.Caroleen Hamman, BSN, RNStroke Nurse NavigatorMHB: [phone] Nurse Navigator is available Monday through Friday, from 8am-4:30pm. Please reach out on [facility] for assistance.

## 2021-10-01 NOTE — Utilization Review (ED)
vertigo, +dizziness, +dysphagia, and blurry vision bilaterallyDeficits improvingCTH negNIHSS 0 at this timezofran given for nauseaUM Status: Managed Medicare ObsCrystal Sequoyah Counterman RN, BSNUtilization Review SpecialistMobile [phone]

## 2021-10-01 NOTE — ED Notes
3:22 PM Assumed care of pt report from Tori RN- Pt A&O x 4- PERRL- clear speech, symmetrical smile- strengths/ sensations presents and equal in bilateral upper and lower extremities- updated on plan of care. 6:27 PMReport given to ED observation.

## 2021-10-02 DIAGNOSIS — G459 Transient cerebral ischemic attack, unspecified: Secondary | ICD-10-CM

## 2021-10-02 DIAGNOSIS — R439 Unspecified disturbances of smell and taste: Secondary | ICD-10-CM

## 2021-10-02 DIAGNOSIS — I10 Essential (primary) hypertension: Secondary | ICD-10-CM

## 2021-10-02 DIAGNOSIS — Z79899 Other long term (current) drug therapy: Secondary | ICD-10-CM

## 2021-10-02 DIAGNOSIS — Z885 Allergy status to narcotic agent status: Secondary | ICD-10-CM

## 2021-10-02 DIAGNOSIS — E041 Nontoxic single thyroid nodule: Secondary | ICD-10-CM

## 2021-10-02 DIAGNOSIS — Z20822 Contact with and (suspected) exposure to covid-19: Secondary | ICD-10-CM

## 2021-10-02 DIAGNOSIS — M810 Age-related osteoporosis without current pathological fracture: Secondary | ICD-10-CM

## 2021-10-02 LAB — COMPREHENSIVE METABOLIC PANEL
BKR A/G RATIO: 1.9 (ref 1.0–2.2)
BKR ALANINE AMINOTRANSFERASE (ALT): 17 U/L (ref 10–35)
BKR ALBUMIN: 3.7 g/dL (ref 3.6–4.9)
BKR ALKALINE PHOSPHATASE: 44 U/L (ref 9–122)
BKR ANION GAP: 9 (ref 7–17)
BKR ASPARTATE AMINOTRANSFERASE (AST): 23 U/L (ref 10–35)
BKR AST/ALT RATIO: 1.4
BKR BILIRUBIN TOTAL: 0.5 mg/dL (ref <=1.2)
BKR BLOOD UREA NITROGEN: 12 mg/dL (ref 8–23)
BKR BUN / CREAT RATIO: 18.5 (ref 8.0–23.0)
BKR CALCIUM: 9.6 mg/dL (ref 8.8–10.2)
BKR CHLORIDE: 103 mmol/L (ref 98–107)
BKR CO2: 28 mmol/L (ref 20–30)
BKR CREATININE: 0.65 mg/dL (ref 0.40–1.30)
BKR EGFR, CREATININE (CKD-EPI 2021): >60 mL/min/{1.73_m2} (ref >=60)
BKR GLOBULIN: 2 g/dL — ABNORMAL LOW (ref 2.3–3.5)
BKR GLUCOSE: 88 mg/dL (ref 70–100)
BKR POTASSIUM: 3.6 mmol/L (ref 3.3–5.3)
BKR PROTEIN TOTAL: 5.7 g/dL — ABNORMAL LOW (ref 6.6–8.7)
BKR SODIUM: 140 mmol/L (ref 136–144)

## 2021-10-02 LAB — CBC WITH AUTO DIFFERENTIAL
BKR WAM ABSOLUTE IMMATURE GRANULOCYTES.: 0.01 x 1000/ÂµL (ref 0.00–0.30)
BKR WAM ABSOLUTE LYMPHOCYTE COUNT.: 1.65 x 1000/ÂµL (ref 0.60–3.70)
BKR WAM ABSOLUTE NRBC (2 DEC): 0 x 1000/ÂµL (ref 0.00–1.00)
BKR WAM ANALYZER ANC: 4.48 x 1000/ÂµL (ref 2.00–7.60)
BKR WAM BASOPHIL ABSOLUTE COUNT.: 0.03 x 1000/ÂµL (ref 0.00–1.00)
BKR WAM BASOPHILS: 0.4 % (ref 0.0–1.4)
BKR WAM EOSINOPHIL ABSOLUTE COUNT.: 0.07 x 1000/ÂµL (ref 0.00–1.00)
BKR WAM EOSINOPHILS: 1 % (ref 0.0–5.0)
BKR WAM HEMATOCRIT (2 DEC): 37.7 % (ref 35.00–45.00)
BKR WAM HEMOGLOBIN: 12.6 g/dL (ref 11.7–15.5)
BKR WAM IMMATURE GRANULOCYTES: 0.1 % (ref 0.0–1.0)
BKR WAM LYMPHOCYTES: 24.5 % (ref 17.0–50.0)
BKR WAM MCH (PG): 31.3 pg (ref 27.0–33.0)
BKR WAM MCHC: 33.4 g/dL (ref 31.0–36.0)
BKR WAM MCV: 93.8 fL (ref 80.0–100.0)
BKR WAM MONOCYTE ABSOLUTE COUNT.: 0.5 x 1000/ÂµL (ref 0.00–1.00)
BKR WAM MONOCYTES: 7.4 % (ref 4.0–12.0)
BKR WAM MPV: 10.8 fL (ref 8.0–12.0)
BKR WAM NEUTROPHILS: 66.6 % (ref 39.0–72.0)
BKR WAM NUCLEATED RED BLOOD CELLS: 0 % (ref 0.0–1.0)
BKR WAM PLATELETS: 194 x1000/ÂµL (ref 150–420)
BKR WAM RDW-CV: 13 % (ref 11.0–15.0)
BKR WAM RED BLOOD CELL COUNT.: 4.02 M/ÂµL (ref 4.00–6.00)
BKR WAM WHITE BLOOD CELL COUNT: 6.7 x1000/ÂµL (ref 4.0–11.0)

## 2021-10-02 LAB — LIPID PANEL
BKR CHOLESTEROL/HDL RATIO: 2.4 (ref 0.0–5.0)
BKR CHOLESTEROL: 174 mg/dL
BKR HDL CHOLESTEROL: 72 mg/dL (ref >=40)
BKR LDL CHOLESTEROL SAMPSON CALCULATED: 89 mg/dL
BKR TRIGLYCERIDES: 66 mg/dL

## 2021-10-02 LAB — HEMOGLOBIN A1C
BKR ESTIMATED AVERAGE GLUCOSE: 117 mg/dL
BKR HEMOGLOBIN A1C: 5.7 % — ABNORMAL HIGH (ref 4.0–5.6)

## 2021-10-02 MED ORDER — SODIUM CHLORIDE 0.9 % BOLUS (NEW BAG)
0.9 % | Freq: Once | INTRAVENOUS | Status: CP
Start: 2021-10-02 — End: ?
  Administered 2021-10-02: 18:00:00 0.9 mL/h via INTRAVENOUS

## 2021-10-02 NOTE — ED Notes
8:26 PM Assumed care of pt. Pt alert and oriented. Reports mild dizziness at this time. PEARLA. Face symmetrical. Speech clear and logical. Follows commands appropriately. NIHSS score O. Denies chest pain. NSR on tele, HR 86. Call bell within reach. WCTM9:47 PMPT OFF UNIT TO mri12:00 AMPt medicated per MAR. OOB to bathroom with standby. reported still feeling slightly dizzy with ambulation. Denied chest pain. NSR on tele, HR60. Call bell within reach. WTM2:55 AMPt asleep at this time. No acute distress noted. WCTM7:02 AMPt with neuros intact. NIHSS 0. NSR on tele, HR 60. Call bell within reach. WCTM

## 2021-10-02 NOTE — Other
Neurology Inpatient Consult NoteChief Complaint:  Weird taste in mouth and nauseaHPI: Raven Allen is a 70 y.o. female past medical history of hypertension who presented to the hospital today in the setting of nausea, stomach discomfort and weird taste in her mouth.The patient was in her normal state of health until date of presentation.  Around 12:00 p.m., she had sudden onset of dizziness and nausea.  Her symptoms did not resolve until she came to the hospital.  She denies any vertigo, double vision, or any focal weakness, or any word-finding difficulty to me.A stroke code was activated.  Blood pressure was noted to be 130/80 systolic.  NIHSS was noted to be 0.  A [place]/CTA was done which showed no evidence of large vessel occlusion or any acute changes.Based off this, Neurology was consulted for further evaluation.Review of systems: denies any focal weakness/ numbness/ tingling/ recent change in vision/ diplopia/ blurry vision/ recent change in hearing/ recent change in gait/ seizure like activity/ spacing out/ urinary incontinence/ tongue bite/ headache/ neck pain/ change in voice/ slurring of speech/ difficulty in 'getting words out'/ recent head trauma/ recent illness/ change in medication/ non-compliance with medication/ loss of consciousness/ changes in sleep patterns/ increased stress.Allergies: Vicodin [hydrocodone-acetaminophen] Current Facility-Administered Medications Medication ? amLODIPine ? heparin (porcine) ? rosuvastatin ? sodium chloride ? sodium chloride Current Outpatient Medications Medication Sig ? amLODIPine TAKE 1 AND 1/2 TABLETS BY MOUTH DAILY ? b complex vitamins Take 1 capsule by mouth daily ? calcium carbonate Take 600 mg by mouth 2 (two) times daily with breakfast and dinner. ? Vitamin D3 Take 1 capsule (1,000 Units total) by mouth daily. ? Co Q-10 Take 1 capsule (100 mg total) by mouth daily. ? GLUCOSAMINE HCL/CHONDR SU A NA (OSTEO BI-FLEX ORAL) Take by mouth. ? multivitamin Take 1 capsule by mouth daily. ? clotrimazole-betamethasone Apply topically 2 (two) times daily as needed. (Patient not taking: Reported on 09/28/2021)  Past Medical History: Diagnosis Date ? H/O colonoscopy 02/19/2018  57yr recall / serrated polyp ? History of peptic ulcer  ? Hypertension   EF > 55% 3/16 ? Osteoporosis   Dr. Yancey Flemings; 2/20 T -2.9; T -1.7 11/17; T -2.1 11/15 ? Prediabetes  ? Thyroid nodule   Dr. Yancey Flemings  Past Surgical History: Procedure Laterality Date ? Arthroscopic surgery of the right knee   ? CATARACT EXTRACTION   ? Cesarean section (x1)   ? History of thyroid biopsy (had an FNA biopsy in 2008 and 2011, unremarkable)   ? SHOULDER SURGERY Right  ? TONSILLECTOMY    Family History Problem Relation Age of Onset ? Osteoporosis Mother  ? Other (data conversion) Cousin       Siblings (4) history of hyperlipidemia 1 brother with MS/daughter with MS/Family history of diabetes mellitus Sister with DM/Family history of hyperlipidemia strong family history/Father health status was reviewed died at 46, MI/Mother health status was reviewed Living with osteoporosis and hypercholesterolemia ? Coronary Artery Disease Father  ? Coronary Artery Disease Brother  ? Hemochromatosis Brother  ? Diabetes Sister   Social History Tobacco Use ? Smoking status: Never ? Smokeless tobacco: Never Substance Use Topics ? Alcohol use: Yes   Comment: occasional glass of wine ? Drug use: No  	Physical Exam BP 117/74  - Pulse 60  - Temp 98.1 ?F (36.7 ?C)  - Resp 20  - Wt 63.7 kg  - SpO2 97%  - BMI 28.36 kg/m?  Mental status: Awake, alert, and attentive. Oriented to person, place, and time. Normal speech/fluency. Follows simple  and complex commands. Naming and repetition intact. CN: I (Olfactory): not explicitly testedII (Optic): Pupils equal, round, and reactive to light bilaterally. Visual fields full.III (Oculomotor), IV (Trochlear), & VI (Abducens): No ptosis. EOM intact. No nystagmus. V (Trigeminal): Sensation intact in the distribution V1-V3.VII (Facial): Face symmetric with normal palpebral fissure and nasolabial fold.VIII (Vestibulocochlear): Hearing grossly intact.IX (Glossopharyngeal) & X (Vagus): No hoarseness or nasal speech.XI (Spinal Accessory): SCM and trapezius 5/5 strengthXII: (Hypoglossal): Tongue protrudes at midlineMotor: Strength:  Right  Left Shoulder abduction 5 5 Elbow flexion 5 5 Elbow extension 5 5 Wrist flexion 5 5 Wrist extension 5 5 Interossei 5 5 Grip strength 5 5 Hip flexion 5 5 Knee extension 5 5 Knee flexion 5 5 Foot dorsiflexion 5 5 Foot plantar flexion 5 5 Coordination:  No dysmetria finger-nose-fingerReflexes:  Right  Left Triceps 2 2 Biceps 2 2 Brachioradialis 2 2 Patellar 2 2 Achilles 2 2 Plantar - - Sensory:  Light touch, vibration intact throughoutLabs:Recent Labs Lab 12/16/221453 12/17/220519 WBC 6.2 6.7 HGB 12.8 12.6 HCT 38.80 37.70 MCV 95.1 93.8 PLT 215 194 Recent Labs Lab 12/16/221453 12/17/220519 12/17/220826 NA 133* 140  --  K 3.6 3.6  --  CL 96* 103  --  CO2 28 28  --  BUN 22 12  --  CREATININE 0.77 0.65  --  GLU 163* 88 114* CALCIUM 9.4 9.6  --  MG 1.6*  --   --  Recent Labs Lab 12/16/221453 PTT 21.8* INR 1.05 Recent Labs Lab 12/16/221453 12/17/220519 ALT 19 17 AST 18 23 BILITOT 0.4 0.5 ALKPHOS 48 44 ALBUMIN 4.2 3.7 Invalid input(s): GLUMETImaging: MRI Brain wo IV ContrastResult Date: 12/16/2022MRI BRAIN WO IV CONTRAST INDICATION: Transient ischemic attack (TIA). COMPARISON: [place] head 10/01/2021 TECHNIQUE: Multiplanar and multisequence MRI of the brain without the administration of intravenous contrast FINDINGS: Limited evaluation due to motion artifact. No acute intracranial infarct or hemorrhage. Periventricular and subcortical T2/FLAIR hyperintense white matter foci are seen, likely a sequela of chronic small vessel ischemic changes. There is no midline shift. Partially empty sella. Mild mucosal thickening of bilateral ethmoid sinuses. Small amount of layering fluid in sphenoid sinuses. Mastoid air cells are clear. The orbits are within normal limits. No acute intracranial infarct or hemorrhage. Report Initiated by:  Consuela Mimes, MD Reported and signed by:  Antonieta Loveless, MD CTA Head Neck Stroke Code W IV ContrastResult Date: 12/16/2022HISTORY:  Neuro deficit, acute, stroke suspected TECHNIQUE:  Routine HISTORY:  Neuro deficit, acute, stroke suspected TECHNIQUE:  Routine CTA head and neck. Reconstructions:  From the original axial multidetector data set, 3D images were created on an independent Vis[employer] workstation.  Iterative reconstructions was utilized to minimize dose. Contrast: 50 cc Omnipaque 350. COMPARISON:  None FINDINGS: CTA Head VASCULAR Anatomy:  No unusual variant or anomalous anatomy. Stenotic/Occlusive Disease: Calcific atherosclerosis of the cavernous internal carotid arteries without hemodynamically significant stenosis Aneurysms:  None. Vascular Malformations:  None. CTA Neck Right Carotid:  No hemodynamically significant stenosis. Left Carotid:  No hemodynamically significant stenosis. Vertebrobasilar System:  Antegrade flow both vertebral arteries. Subclavian Arteries:  Unremarkable. Arch:  Unremarkable. Nonvascular Structures:  Enlarged thyroid gland with heterogenous nodule predominantly in the isthmus and left lobe measuring approximately 3.7 x 3.2 cm. Stenosis ranges are derived utilizing NASCET criteria.  No high-grade stenosis or occlusion of the cervical or large intracranial vasculature. Large left and isthmus thyroid nodule, by report this was previously biopsied in 2011. Above findings were communicated to and acknowledged by Cronsell,Jennifer,MD at 10/01/2021 2:56 PM. Reported and  signed by:  Aliene Altes, MD [place] Head Stroke Code WO IV ContrastResult Date: 12/16/2022CLINICAL INFORMATION: Neuro deficit, acute, stroke suspected TECHNIQUE: [place] Brain without contrast.  Protocol: Routine.  Iterative reconstruction was utilized to minimize dose. COMPARISON:  None FINDINGS: Brain Parenchyma: No hemorrhage or acute territorial infarct. Small cortical calcification in left lateral frontal lobe, could be related to prior infection. Extra Axial Spaces:  There is a partially empty sella turcica. This is usually an incidental finding, although it may rarely be an indicator of intracranial hypertension. Ventricular System:  Normal size and configuration. Visualized Paranasal Sinuses/Mastoids:  Bilateral sphenoid sinuses mucosal thickening. Bone:  No acute or suspicious abnormalities.  No parenchymal hemorrhage or acute territorial infarct. Partially empty sella turcica, incidental finding, although it may rarely be an indicator of intracranial hypertension Above findings were communicated to and acknowledged by Joelyn Oms, MD at 10/01/2021 2:48 PM. Reported and signed by:  Aliene Altes, MD Assessment/Plan:Ms. Munford is a 70 y.o. female with PMHx notable for hypertension who presented to the hospital today in the setting of nausea, stomach discomfort and weird taste in her mouth.  Based off history and examination, etiology of her symptoms unclear at this time.  Her symptoms do not localize a clearly central location from a neurologic perspective.  Stroke code was activated and did not find any evidence of any acute changes on earlier imaging.  MRI shows no evidence of stroke. - MRI brain negative for stroke, no further inpatient workup at this time- check orthostatic vital signs- toxic metabolic workup- neurology consult team to sign off, please call with questions #Emergency Contact: Primary Emergency Contact: Gemma,Agusto, Home Phone: [phone] Mike Craze, MD Assistant Professor of Clinical Neurology12/17/22

## 2021-10-02 NOTE — ED Provider Notes
Raven Allen is a 70 year old female with a history of peptic ulcer disease, HTN, osteoporosis, thyroid nodule who presents as a stroke code due to word-finding and coordination issues with last known normal at 12:00 p.m. noon today.  Patient denies any chest pain or shortness of breath but states that she is having some burning sensation to her chest..  No history of recent illness including no fever, chills, nausea or vomiting, diarrhea.  Additionally no history of recent medication changes or recent trauma.Blood pressure (!) 160/79, pulse 90, temperature 98.2 ?F (36.8 ?C), temperature source Oral, resp. rate 20, weight 63.7 kg (140 lb 6.9 oz), SpO2 94 %. Exam notable UJW:[id number] patient sitting up in exam bed in no acute distress with no obvious signs of traumatic injury notable for dysmetria on finger-to-nose testing with mild word-finding difficulty but no dysarthria.NIH Stroke ScaleInterval: BaselineTime: 2:28 PMPerson Administering Scale: Edward Qualia, MDAdminister stroke scale items in the order listed. Record performance in each category after each subscale exam. Do not go back and change scores. Follow directions provided for each exam technique. Scores should reflect what the patient does, not what the clinician thinks the patient can do. The clinician should record answers while administering the exam and work quickly. Except where indicated, the patient should not be coached (i.e., repeated requests to patient to make a special effort).1a  Level of consciousness: 0=alert; keenly responsive 1b. LOC questions:  0=Performs both tasks correctly 1c. LOC commands: 0=Performs both tasks correctly 2.  Best Gaze: 0=normal 3.  Visual: 0=No visual loss 4. Facial Palsy: 0=Normal symmetric movement 5a.  Motor left arm: 0=No drift, limb holds 90 (or 45) degrees for full 10 seconds 5b.  Motor right arm: 0=No drift, limb holds 90 (or 45) degrees for full 10 seconds 6a. motor left leg: 0=No drift, limb holds 90 (or 45) degrees for full 10 seconds 6b  Motor right leg:  0=No drift, limb holds 90 (or 45) degrees for full 10 seconds 7. Limb Ataxia: 1=Present in one limb 8.  Sensory: 0=Normal; no sensory loss 9. Best Language:  1=Mild to moderate aphasia; some obvious loss of fluency or facility of comprehension without significant limitation on ideas expressed or form of expression. 10. Dysarthria: 0=Normal 11. Extinction and Inattention: 0=No abnormality  Total:   2 MDM/ED Course:70 year old female with history as above presents with dysmetria, aphasia with last known normal at 12:00 p.m. noon today concerning for CVA, intracranial pathology, metabolic derangements.2:28 PMPt to [place] for CTA head2:49 PMPatient currently being interviewed by [facility] telehealth physician.2:57 PMDry [place] head as well as CTA read as arguing remarkable.Patient reassessed and symptoms appear to be improving with less notable dysmetria to her left upper extremity.  No immediate interventions recommended by tele stroke team.  Will continue to monitor patient in the emergency department follow-up on pending labs.DISPO:Seen under supervision of Dr. Kristeen Miss, MDEmergency MedicineThis note may have been produced using voice transcription software: please excuse any typos, and feel free to contact me via Mobile [employer] for clarification(s).HistoryChief Complaint Patient presents with ? Dizziness   Sudden onset nausea and dizziness. Denies CP or sob.  ? Stroke Code   Dizziness onset 12pm, persistent, blurry vision  The history is provided by the patient, the spouse, medical records and the EMS personnel. OtherThis is a new problem. The current episode started 3 to 5 hours ago. The problem occurs constantly. The problem has not changed since onset.Pertinent negatives include no abdominal pain, no headaches and no shortness of breath. Nothing relieves  the symptoms.  Past Medical History: Diagnosis Date ? H/O colonoscopy 02/19/2018  44yr recall / serrated polyp ? History of peptic ulcer  ? Hypertension   EF > 55% 3/16 ? Osteoporosis   Dr. Yancey Flemings; 2/20 T -2.9; T -1.7 11/17; T -2.1 11/15 ? Prediabetes  ? Thyroid nodule   Dr. Yancey Flemings Past Surgical History: Procedure Laterality Date ? Arthroscopic surgery of the right knee   ? CATARACT EXTRACTION   ? Cesarean section (x1)   ? History of thyroid biopsy (had an FNA biopsy in 2008 and 2011, unremarkable)   ? SHOULDER SURGERY Right  ? TONSILLECTOMY   Family History Problem Relation Age of Onset ? Osteoporosis Mother  ? Other (data conversion) Cousin       Siblings (4) history of hyperlipidemia 1 brother with MS/daughter with MS/Family history of diabetes mellitus Sister with DM/Family history of hyperlipidemia strong family history/Father health status was reviewed died at 41, MI/Mother health status was reviewed Living with osteoporosis and hypercholesterolemia ? Coronary Artery Disease Father  ? Coronary Artery Disease Brother  ? Hemochromatosis Brother  ? Diabetes Sister  Social History Socioeconomic History ? Marital status: Married Tobacco Use ? Smoking status: Never ? Smokeless tobacco: Never Substance and Sexual Activity ? Alcohol use: Yes   Comment: occasional glass of wine ? Drug use: No ED Other Social History E-cigarette/Vaping Substances E-cigarette/Vaping Devices Review of Systems Constitutional: Negative.  HENT: Negative.  Eyes: Negative.  Respiratory: Negative.  Negative for shortness of breath.  Gastrointestinal: Negative.  Negative for abdominal pain. Endocrine: Negative.  Genitourinary: Negative.  Musculoskeletal: Negative.  Skin: Negative. Neurological: Positive for dizziness. Negative for headaches. Psychiatric/Behavioral: Negative.   Physical ExamED Triage Vitals [10/01/21 1406]BP: (!) 174/88Pulse: 88Pulse from  O2 sat: n/aResp: 18Temp: 97.2 ?F (36.2 ?C)Temp src: TemporalSpO2: 100 % BP (!) 160/79  - Pulse 90  - Temp 98.2 ?F (36.8 ?C) (Oral)  - Resp 20  - Wt 63.7 kg (140 lb 6.9 oz)  - SpO2 94%  - BMI 28.36 kg/m? Physical ExamVitals and nursing note reviewed. HENT:    Head: Atraumatic.    Nose: Nose normal.    Mouth/Throat:    Mouth: Mucous membranes are moist.    Pharynx: Oropharynx is clear. Eyes:    Extraocular Movements: Extraocular movements intact.    Pupils: Pupils are equal, round, and reactive to light. Cardiovascular:    Rate and Rhythm: Normal rate.    Pulses: Normal pulses. Pulmonary:    Effort: Pulmonary effort is normal. Abdominal:    Palpations: Abdomen is soft. Musculoskeletal:       General: Normal range of motion.    Cervical back: Normal range of motion. Skin:   General: Skin is warm.    Capillary Refill: Capillary refill takes less than 2 seconds. Neurological:    Mental Status: She is alert and oriented to person, place, and time.  ProceduresED Critical CarePerformed by: Dema Severin, DOAuthorized by: Dema Severin, DO Critical care provider statement:   Critical care time (minutes):  60  Critical care time was exclusive of:  Separately billable procedures and treating other patients  Critical care was necessary to treat or prevent imminent or life-threatening deterioration of the following conditions:  CNS failure or compromise ED COURSEECG interpreted by attending? interpreted by ED physicianRate: normalRhythm: sinus rhythm.S-T segments: normal.Interpretation: normal ECG.Patient Reevaluation: I performed a history and physical examination of Raven Allen and discussed her management with Dr. Gae Gallop. I agree with the history, physical, assessment, and plan of  care, with the following exceptions:  Patient arrived to the emergency department with vertigo, expressive aphasia, and left-sided dysmetria.  She was given Reglan in route by EMS.  Last normal was at noon today.  She was made a stroke alert upon arrival to the emergency department.  Following imaging and discussion with tele stroke, her symptoms gradually resolved and thrombolytics were not given.  Admitted to the hospital for further evaluation and care.Dema Severin, DOStroke DocumentationStroke/TIA: Lollie Marrow score: 2NIH score done: within 30 minutesLast seen well: 10/01/2021 12:00 PMDiscussed risks and benefits of thrombolysis with patientCT result discussed with RadiologyWas Alteplase given? noExclusion reason(s): Stroke severity too mild (non-disabling)Comments as of 10/01/21 2018 Fri Oct 01, 2021 1656 Tia obs zgang [RK]  Comments User Index[RK] Collier Bullock, MD   Clinical Impressions as of 10/01/21 2018 TIA (transient ischemic attack)  ED DispositionObservation Dema Severin, DO12/16/22 2019

## 2021-10-02 NOTE — Progress Notes
Physical Therapy Evaluation and Discharge NoteDefault Flowsheet Data (most recent)   IP PT Discharge Evaluation - 10/02/21 0948    Date of Visit / Treatment  Date of Visit / Treatment 10/02/21   Note Type Evaluation   Start Time 940   End Time 948   Total Treatment Time 8    Patient Overview  History of Present Illness Pt is a 70 y.o woman presenting to ED with dizziness, dysphagia and blurry vision bilaterally, word finding difficulties.  Head [place]  and CTA negative for ischemic changes.  MRI shows not acute intracranial infarct or hemorrhage.  No need for TKN as symptoms improved. PMH includes, but is not limited to, HTN, peptic ulcer disease.  Pt now presents for mobility assessment with PT.   Precautions none   Social History lives with spouse;in a home;stairs present   Social History - Additional Details/Comments [place]. FOS inside, STE. tub and walk in shower in house, chair present.   Prior Level of Function independent with mobility;independent with ADLs   Prior Level of Function - Additional Details/Comments Does not use any AE. 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 Mother is very aggressive and pt reports extreme stress related to taking care of her mother.   Subjective I clean houses for work   General Observations Pre and post PT in bed.  RN consulted pre PT and updated post PT.    Assessment  Cognition (Mentation/Communication) oriented x 4   Vital Signs vital signs stable   Pain Rating 0   Skin Integrity/Edema intact to visibility;see skin documentation   Sensation intact to light touch   Range of Motion within functional limits   Muscle Strength/Tone within functional limits   Balance within functional limits    Functional Mobility  Rolling Complete independence   Supine to/from Sit Complete independence   Sit to/from Stand Complete independence   Sit to/from Stand Device No device   Toilet Transfer Complete independence   Toilet Transfer Device No device   Bed to/from Chair Complete independence   Bed to/from Chair Device No device   Ambulation Complete independence   Ambulation Device No device   Ambulation Distance 400 feet   gait speed without AD= 1.0 indicating community ambulation without risk for falls  Stair Negotiation Complete independence   Stair Negotiation Details 1 flight   step over step without rails  Weight Bearing Restriction with Mobility N/A   Overall Functional Mobility Comments Pt is at her baseline level of mobility    PT- AM-PAC - Basic Mobility Screen- How much help from another person do you currently need.....  Turning from your back to your side while in a a flat bed without using rails? 4 - None - Does not require any help and does the activity independently. Can use assistive devices.   Moving from lying on your back to sitting on the side of a flat bed without using bed rails? 4 - None - Does not require any help and does the activity independently. Can use assistive devices.   Moving to and from a bed to a chair (including a wheelchair)? 4 - None - Does not require any help and does the activity independently. Can use assistive devices.   Standing up from a chair using your arms(e.g., wheelchair or bedside chair)? 4 - None - Does not require any help and does the activity independently. Can use assistive devices.   To walk in  a hospital room? 4 - None - Does not require any help and does the activity independently. Can use assistive devices.   Climbing 3-5 steps with a railing? 4 - None - Does not require any help and does the activity independently. Can use assistive devices.   AMPAC Mobility Score 24   TARGET Highest Level of Mobility Mobility Level 8 Walk 250+ feet    PT Recommendations for Inpatient Admission  Activity/Level of Assist ambulate;independent;no device;in room;in hall   ADL Recommendations independent;no device    Clinical Impression / Recommendation  Initial Assessment Pt is safely able to ambulate in the room and hall without AD.  Pt does not need PT follow up.  Discontinue PT orders.   Patient Goal return home   PT Frequency Discharge   Reason for Discharge (PT) no further needs identified   Physical Therapy Disposition Recommendation Home   Additional Physical Therapy Disposition Recommendations No Physical Therapy needs anticipated;Home with family support   Equipment Recommendations for Discharge No durable medical equipment needed    Handoff Documentation  Handoff Patient in bed;Discussed with nursing     Larina Earthly  PT, [id number] of Physical TherapyLevel III Clinician

## 2021-10-02 NOTE — Other
[facility]	 Patient Name: Raven Allen: 70 y.o.Sex: femaleMRN: [id number] Date of Birth: [birth year]Telestroke NoteStroke Details Initial Evaluation:Consult Requested (Phone call from [employer]): 10/01/2021 3:00 PMConsult request responded (Video initiated): 10/01/2021 3:10 PMVerbal Consent: Vaughan Browner seen well: 10/01/2021 12:00 PMSymptoms first discovered: 10/01/2021 12:00 PMHistory was provided by: patientWas video used? YesDid you experience technical difficulty? NoHistory of Present Illness/Description of Event:70 yo female with PMHx HTN, who is here with nausea, stomach discomfort and weird taste in her mouth. Patient denies any focal neurologic symptoms of vertigo or double vision. Clinical Evaluation:Vital signs:Blood pressure: 130/80NIHSS1a. Level of consciousness 0 - Alert 1b. Level of consciousness questions 0 - Answers both correctly 1c. Level of consciousness command 0 - Executes both correctly 2.   Best gaze 0 - Normal 3.   Visual 0 - No visual loss 4.   Facial palsy 0 - Normal 5a. Motor left arm 0 - No drift 5b. Motor right arm 0 - No drift 6a. Motor left leg 0 - No drift 6b. Motor right leg 0 - No drift 7.   Limb ataxia 0 - Absent 8.   Sensory 0 - Normal 9.   Best language 0 - No aphasia 10. Dysarthria  0 - Normal articulation 11. Extinction & Inattention 0 - No neglect NIHSS Total Score 0 Neuroimaging:Modality: [place] and CTAImage findings: no acute ischemic changesPreliminary diagnosis: OtherAssessment and Plan Assessment: Patient is not here with neurologic deficits and is mainly complaining of nausea, weird taste in her mouth and stomach discomfort. Exam is non-focal. Imaging shows no acute ischemia Disposition Pt will remain at referring hospital.Recommendations Rest of care per EDParticipants at the Bedside:attending of record Role -: Assistance with historyThis clinician is part of the telehealth program and is conducting this visit in a currently approved location. For this visit all clinicians and the patient were present via interactive audio & video telecommunications system that permits real-time communications.Precious Gilding, MD [phone]/16/20225:15 PMHardik AminStroke attending

## 2021-10-02 NOTE — Plan of Care
Occupational Therapy EvaluationDefault Flowsheet Data (most recent)   IP OT Discharge Evaluation - 10/02/21 0849    Date of Visit / Treatment  Date of Visit / Treatment 10/02/21   Note Type Evaluation   Start Time 0836   End Time 0849   Total Treatment Time 13    Patient Overview  History of Present Illness Pt is a 70 year old female with PMH including peptic ulcer disease, HTN, osteoporosis, thyroid nodule who presents to [facility] ED as a stroke code due to word-finding and coordination difficulties. CTH and CTA negative for actionable findings   Precautions fall   Social History lives with spouse;in a home;stairs present   Social History - Additional Details/Comments [place] FOS inside, STE. tub and walk in shower in house, chair present.   Prior Level of Function independent with mobility;independent with ADLs   Prior Level of Function - Additional Details/Comments Does not use any AE. 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 Mother is very aggressive and pt reports extreme stress related to taking care of her mother.   Subjective This is all stress related, I've never been sick   General Observations RN contacted and ok'd session. Pt received supine, agreeable to OT. Pt left as received, all needs met and call bell within reach.    Assessment  Cognition (Mentation/Communication) alert;oriented x 4   Cognition - Additional Details/Comments Very conversive, no evidence of confusion   Vital Signs vital signs stable   Pain Rating 0   Skin Integrity/Edema intact to visibility;see skin documentation   Sensation intact to light touch;no complaints   Range of Motion within functional limits   Muscle Strength/Tone within functional limits   Muscle Strength/Tone - Additional Details/Comments 4/5 BUEs   Balance within functional limits;no loss of balance   Balance - Additional Details/Comments no AE Functional Mobility  Supine to/from Sit Complete independence   Sit to/from Stand Complete independence   Sit to/from Stand Device No device   Ambulation Complete independence   Ambulation Device No device   Overall Functional Mobility Comments Pt reporting independence with toileting and brushing teeth this morning.    Activities of Daily Living  Lower Body Dressing Complete independence   Grooming Complete independence     AM-PAC - Daily Activity IP Short Form  Help needed from another person putting on/taking off regular lower body clothing 4 - None   Help needed from another person for bathing (incl. washing, rinsing, drying) 4 - None   Help needed from another person for toileting (incl. using toilet, bedpan, urinal) 4 - None   Help needed from another person putting on/taking off regular upper body clothing 4 - None   Help needed from another person taking care of personal grooming such as brushing teeth 4 - None   Help needed from another person eating meals 4 - None   AM-PAC Daily Activity Raw Score (Total of rows above) 24   CMS Score (based on Raw Score - with G Code) 24 - 0.00% impaired     (G Code - CH)    OT Recommendations for Inpatient Admission  ADL Recommendations independent    Clinical Impression / Recommendation  Initial Assessment Pt was seen this morning for OT evaluation, tolerating well. Pt reporting all symptoms resolving and just feels tired now. She is alert and oriented x4, no signs of confusion or word finding difficulties. She completed all transfers and ADLs independently and demo'd  WFL strength and ROM of BUEs. Pt is at her PLOF and does not present with any OT needs at this time. She is safe for d/c home from OT standpoint. OT to sign off, please reconsult if further needs arise.   Patient Goal return home   OT Frequency Cleared   Reason for Discharge (OT) no further needs identified   Disposition Recommendation Home Additional Disposition Recommendations No Occupational Therapy needs anticipated   Equipment Recommendations for Discharge Patient has all necessary durable medical equipment    Handoff Documentation  Handoff Patient in bed     Rafal Archuleta, OTR/L

## 2021-10-02 NOTE — Utilization Review (ED)
UM Status: UR Reviewed-Continue Observation ServicesPresented w/ abnormal sensation, dizziness and dysarthria. OT rec home. Neuros negative, NIHSS 0. MRI brain negative. 1:10 PMD/c order in.

## 2021-10-02 NOTE — ED Notes
9:33 AM Assumed care of patient at 7 AM, received report from RN Oral. Patient is alert and oriented x4. Denies pain or discomfort at this time. Raven Allen snot appear to be in acute distress. No dizziness, lightheaded, chest pain at this time. States she feel tired, no other complaints, does not appear to be in acute distress. Safety maintained, bed in low position, call bell in reach of patient WCTM.1:51 PM Patient ready for discharge. Removed IV access. Reviewed discharge instructions with patient and her husband, both acknowledged understanding of instructions. Patient self walked off unit.

## 2021-10-03 NOTE — Discharge Summary
[facility] SummaryPatient Data:  Patient Name: Raven Allen Admit date: 10/01/2021 Age: 70 y.o. Discharge date:10/02/2021 DOB: Dec 14, 1950	 Discharge Attending Physician: Rondall Allegra  MRN: [id number]	 Discharged Condition: stable PCP: Marjean Donna, MD Disposition: Home  Principal Diagnosis:  Nausea/dizziness abnormal taste sensationSecondary Diagnoses:  Subacute throat discomfortIssues to be Addressed Post Discharge: Issues to be Addressed Post Discharge:1.	Outpatient ENT and GI follow-up as previously planned2.	Relevant Medications on Discharge:NonePending Labs and Tests: Pending Lab Results   Order Current Status  Hemoglobin A1c In process  Follow-up Information:Nori, Iantha Fallen [address] [id number] up Future Appointments Date Time Provider Department Center 01/17/2022  4:00 PM Marjean Donna, MD PM INT TRU1 [facility] 08/22/2022  9:30 AM Cherlyn Roberts, MD PM ENDO TRU [facility] Course: Hospital Course:  70 year old female with PMH hypertension under lot of emotional anxiety regarding the care of her elderly mother placed in observation for episode of dizziness nausea and strange taste in her mouth.  She also describes that she is had subacute discomfort on the left side of her neck in his scheduled to see ENT as outpatient.  In the ED a stroke code was activated blood pressure was 130/80.  NIHSS score was 0.  She had no recurrence of her symptoms.  Lab work showed normal CBC, electrolytes, renal function LFTs.  She underwent CTA head and neck stroke protocol as well as noncontrast [place] of the head, noncontrast MRI all of which showed no acute findings.  She was seen in consultation by Neurology who felt her symptoms did not represent a central event.Orthostatic vital signs were checked and blood pressure did drop from 130 to 110 although she had no symptoms with this..  She was hydrated with normal saline prior to discharge.  She was asymptomatic with ambulation.  She is encouraged to increase fluids maintain adequate hydration.Inpatient Consultants and summary of recommendations:NeurologyPertinent Procedures or Surgeries: Pertinent lab findings and test results: Objective: Recent Labs Lab 12/13/221458 12/16/221453 12/16/221453 12/17/220519 WBC 6.8 6.2  --  6.7 HGB 13.5 12.8   < > 12.6 HCT 40.9 38.80   < > 37.70 PLT 236 215   < > 194  < > = values in this interval not displayed.  Recent Labs Lab 12/13/221458 12/17/220519 NEUTROPHILS 64.7 66.6  Recent Labs Lab 12/13/221458 12/16/221419 12/16/221453 12/17/220519 12/17/220826 NA 138  --  133* 140  --  K 4.0   < > 3.6 3.6  --  CL 101   < > 96* 103  --  CO2 29   < > 28 28  --  BUN 28*   < > 22 12  --  CREATININE 0.81   < > 0.77 0.65  --  GLU 94   < > 163* 88 114* ANIONGAP  --    < > 9 9  --   < > = values in this interval not displayed.  Recent Labs Lab 12/13/221458 12/16/221453 12/17/220519 CALCIUM 10.2 9.4 9.6 MG  --  1.6*  --   Recent Labs Lab 12/16/221453 12/17/220519 ALT 19 17 AST 18 23 ALKPHOS 48 44 BILITOT 0.4 0.5  Recent Labs Lab 12/16/221453 PTT 21.8* LABPROT 10.9 INR 1.05  Culture Information:No results for input(s): LABBLOO, LABURIN, LOWERRESPIRA in the last 168 hours.Imaging: Imaging results last 24h: MRI Brain wo IV ContrastResult Date: 12/16/2022No acute intracranial infarct or hemorrhage. Report Initiated by:  Consuela Mimes, MD Reported and signed by:  Antonieta Loveless, MD CTA Head Neck Stroke Code W IV ContrastResult Date: 10/01/2021 No high-grade stenosis  or occlusion of the cervical or large intracranial vasculature. Large left and isthmus thyroid nodule, by report this was previously biopsied in 2011. Above findings were communicated to and acknowledged by Cronsell,Jennifer,MD at 10/01/2021 2:56 PM. Reported and signed by:  Aliene Altes, MD [place] Head Stroke Code WO IV ContrastResult Date: 10/01/2021 No parenchymal hemorrhage or acute territorial infarct. Partially empty sella turcica, incidental finding, although it may rarely be an indicator of intracranial hypertension Above findings were communicated to and acknowledged by Joelyn Oms, MD at 10/01/2021 2:48 PM. Reported and signed by:  Aliene Altes, MD Diet:  Regular dietMobility:    Physical Exam Discharge vitals: Temp:  [97.2 ?F (36.2 ?C)-98.4 ?F (36.9 ?C)] 98.1 ?F (36.7 ?C)Pulse:  [60-93] 60Resp:  [15-20] 20BP: (117-174)/(61-88) 117/74SpO2:  [94 %-100 %] 97 %Device (Oxygen Therapy): room air Pertinent Findings of Physical Exam: UnremarkableCognitive Status at Discharge: Alert and Oriented x 3Discharge Physical Exam:Physical ExamConstitutional:     Appearance: Normal appearance. HENT:    Head: Normocephalic. Cardiovascular:    Rate and Rhythm: Normal rate and regular rhythm.    Pulses: Normal pulses.    Heart sounds: Normal heart sounds. Pulmonary:    Effort: Pulmonary effort is normal. Musculoskeletal:       General: Normal range of motion.    Cervical back: Normal range of motion. Skin:   General: Skin is warm. Neurological:    General: No focal deficit present.    Mental Status: She is alert. Psychiatric:       Mood and Affect: Mood normal. Allergies Allergies Allergen Reactions ? Vicodin [Hydrocodone-Acetaminophen] Syncope  PMH PSH Past Medical History: Diagnosis Date ? H/O colonoscopy 02/19/2018  21yr recall / serrated polyp ? History of peptic ulcer  ? Hypertension   EF > 55% 3/16 ? Osteoporosis   Dr. Yancey Flemings; 2/20 T -2.9; T -1.7 11/17; T -2.1 11/15 ? Prediabetes  ? Thyroid nodule   Dr. Yancey Flemings  Past Surgical History: Procedure Laterality Date ? Arthroscopic surgery of the right knee   ? CATARACT EXTRACTION   ? Cesarean section (x1)   ? History of thyroid biopsy (had an FNA biopsy in 2008 and 2011, unremarkable)   ? SHOULDER SURGERY Right  ? TONSILLECTOMY    Social History Family History Social History Tobacco Use ? Smoking status: Never ? Smokeless tobacco: Never Substance Use Topics ? Alcohol use: Yes   Comment: occasional glass of wine  Family History Problem Relation Age of Onset ? Osteoporosis Mother  ? Other (data conversion) Cousin       Siblings (4) history of hyperlipidemia 1 brother with MS/daughter with MS/Family history of diabetes mellitus Sister with DM/Family history of hyperlipidemia strong family history/Father health status was reviewed died at 65, MI/Mother health status was reviewed Living with osteoporosis and hypercholesterolemia ? Coronary Artery Disease Father  ? Coronary Artery Disease Brother  ? Hemochromatosis Brother  ? Diabetes Sister   Discharge Medications: Discharge: Current Discharge Medication List  CONTINUE these medications which have NOT CHANGED  Details amLODIPine (NORVASC) 5 mg tablet TAKE 1 AND 1/2 TABLETS BY MOUTH DAILYQty: 135 tablet, Refills: 1  b complex vitamins capsule Take 1 capsule by mouth daily  calcium carbonate (OS-CAL) 600 mg (1,500 mg) Tab tablet Take 600 mg by mouth 2 (two) times daily with breakfast and dinner.  cholecalciferol, vitamin D3, (VITAMIN D3) 1,000 unit Cap Take 1 capsule (1,000 Units total) by mouth daily.  co-enzyme Q-10 (CO Q-10) 100 mg Cap Take 1 capsule (100 mg total) by mouth daily.  GLUCOSAMINE HCL/CHONDR SU A NA (OSTEO BI-FLEX ORAL) Take by mouth.  multivitamin capsule Take 1 capsule by mouth daily.   There was at total of approximately spent on the discharge of this patientElectronically Signed:Surya Folden Ferol Luz, NP 10/02/2021 12:19 PMBest Contact Information: hosp

## 2021-10-04 ENCOUNTER — Encounter: Admit: 2021-10-04 | Payer: PRIVATE HEALTH INSURANCE | Primary: Internal Medicine

## 2021-10-04 ENCOUNTER — Inpatient Hospital Stay: Admit: 2021-10-04 | Discharge: 2021-10-04 | Payer: PRIVATE HEALTH INSURANCE | Primary: Internal Medicine

## 2021-10-04 ENCOUNTER — Ambulatory Visit: Admit: 2021-10-04 | Payer: PRIVATE HEALTH INSURANCE | Attending: Internal Medicine | Primary: Internal Medicine

## 2021-10-04 DIAGNOSIS — J029 Acute pharyngitis, unspecified: Secondary | ICD-10-CM

## 2021-10-04 LAB — GROUP A STREPTOCOCCUS BY PCR     (BH GH LMW YH): BKR GROUP A STREP PCR: NOT DETECTED

## 2021-10-04 MED ORDER — PRAVASTATIN 10 MG TABLET
10 | ORAL_TABLET | Freq: Every day | ORAL | 2 refills | 90.00000 days | Status: AC
Start: 2021-10-04 — End: 2021-11-08

## 2021-10-27 ENCOUNTER — Encounter
Admit: 2021-10-27 | Payer: PRIVATE HEALTH INSURANCE | Attending: Vascular and Interventional Radiology | Primary: Internal Medicine

## 2021-11-08 ENCOUNTER — Encounter
Admit: 2021-11-08 | Payer: PRIVATE HEALTH INSURANCE | Attending: Vascular and Interventional Radiology | Primary: Internal Medicine

## 2021-11-08 ENCOUNTER — Ambulatory Visit: Admit: 2021-11-08 | Payer: PRIVATE HEALTH INSURANCE | Attending: Family | Primary: Internal Medicine

## 2021-11-08 ENCOUNTER — Encounter: Admit: 2021-11-08 | Payer: Medicare (Managed Care) | Attending: Family | Primary: Internal Medicine

## 2021-11-08 ENCOUNTER — Inpatient Hospital Stay: Admit: 2021-11-08 | Discharge: 2021-11-08 | Payer: PRIVATE HEALTH INSURANCE | Primary: Internal Medicine

## 2021-11-08 ENCOUNTER — Encounter: Admit: 2021-11-08 | Payer: PRIVATE HEALTH INSURANCE | Attending: Family | Primary: Internal Medicine

## 2021-11-10 ENCOUNTER — Encounter
Admit: 2021-11-10 | Payer: PRIVATE HEALTH INSURANCE | Attending: Vascular and Interventional Radiology | Primary: Internal Medicine

## 2021-11-23 ENCOUNTER — Encounter
Admit: 2021-11-23 | Payer: PRIVATE HEALTH INSURANCE | Attending: Vascular and Interventional Radiology | Primary: Internal Medicine

## 2022-01-17 ENCOUNTER — Telehealth: Admit: 2022-01-17 | Payer: PRIVATE HEALTH INSURANCE | Attending: Internal Medicine | Primary: Internal Medicine

## 2022-01-17 ENCOUNTER — Encounter: Admit: 2022-01-17 | Payer: Medicare (Managed Care) | Attending: Internal Medicine | Primary: Internal Medicine

## 2022-01-17 ENCOUNTER — Encounter: Admit: 2022-01-17 | Payer: PRIVATE HEALTH INSURANCE | Attending: Internal Medicine | Primary: Internal Medicine

## 2022-01-17 ENCOUNTER — Ambulatory Visit: Admit: 2022-01-17 | Payer: Medicare (Managed Care) | Attending: Internal Medicine | Primary: Internal Medicine

## 2022-01-17 MED ORDER — FLUTICASONE PROPIONATE 50 MCG/ACTUATION NASAL SPRAY,SUSPENSION
50 | NASAL | 3.00 refills | 30.00000 days | Status: AC
Start: 2022-01-17 — End: ?

## 2022-01-20 ENCOUNTER — Ambulatory Visit: Admit: 2022-01-20 | Payer: Medicare (Managed Care) | Primary: Internal Medicine

## 2022-02-21 ENCOUNTER — Encounter: Admit: 2022-02-21 | Payer: PRIVATE HEALTH INSURANCE | Attending: Family | Primary: Internal Medicine

## 2022-02-21 ENCOUNTER — Ambulatory Visit: Admit: 2022-02-21 | Payer: PRIVATE HEALTH INSURANCE | Attending: Family | Primary: Internal Medicine

## 2022-02-21 ENCOUNTER — Encounter: Admit: 2022-02-21 | Payer: Medicare (Managed Care) | Attending: Family | Primary: Internal Medicine

## 2022-02-21 ENCOUNTER — Inpatient Hospital Stay: Admit: 2022-02-21 | Discharge: 2022-02-21 | Payer: PRIVATE HEALTH INSURANCE | Primary: Internal Medicine

## 2022-02-21 DIAGNOSIS — E7800 Pure hypercholesterolemia, unspecified: Secondary | ICD-10-CM

## 2022-02-21 MED ORDER — PRAVASTATIN 10 MG TABLET
10 | Freq: Every day | ORAL | 3.00 refills | 90.00000 days | Status: AC
Start: 2022-02-21 — End: 2022-08-29

## 2022-02-28 ENCOUNTER — Telehealth: Admit: 2022-02-28 | Payer: PRIVATE HEALTH INSURANCE | Attending: Internal Medicine | Primary: Internal Medicine

## 2022-05-10 ENCOUNTER — Encounter: Admit: 2022-05-10 | Payer: PRIVATE HEALTH INSURANCE | Attending: Internal Medicine | Primary: Internal Medicine

## 2022-05-11 ENCOUNTER — Encounter: Admit: 2022-05-11 | Payer: PRIVATE HEALTH INSURANCE | Attending: Internal Medicine | Primary: Internal Medicine

## 2022-05-12 MED ORDER — AMLODIPINE 5 MG TABLET
5 | ORAL_TABLET | ORAL | 2 refills | 90.00000 days | Status: AC
Start: 2022-05-12 — End: 2022-11-10

## 2022-05-16 ENCOUNTER — Encounter
Admit: 2022-05-16 | Payer: PRIVATE HEALTH INSURANCE | Attending: Vascular and Interventional Radiology | Primary: Internal Medicine

## 2022-08-14 IMAGING — MR [facility]
7 series · 40 of 40 positions shown · non-contrast
Comparison: none

[Series 3001: sag pd_tse_fs · sagittal · right · 3.5mm · 0.50mm/px · 4 of 20 slices shown]
[im 1/20]
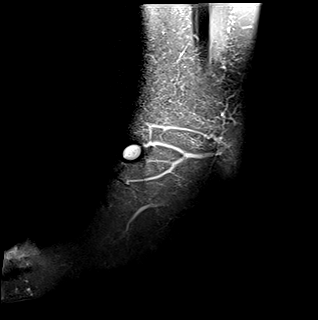
[im 7/20]
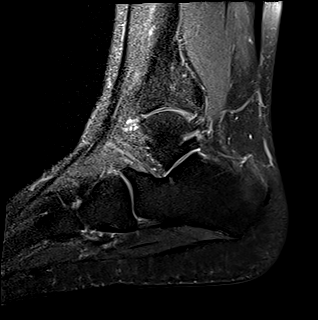
[im 13/20]
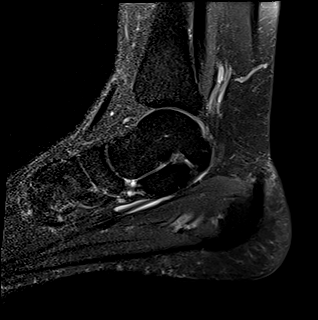
[im 20/20]
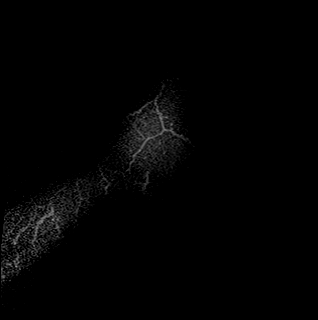

[Series 4001: sag_t1_tse · sagittal · right · 3.5mm · 0.28mm/px · 4 of 20 slices shown]
[im 1/20]
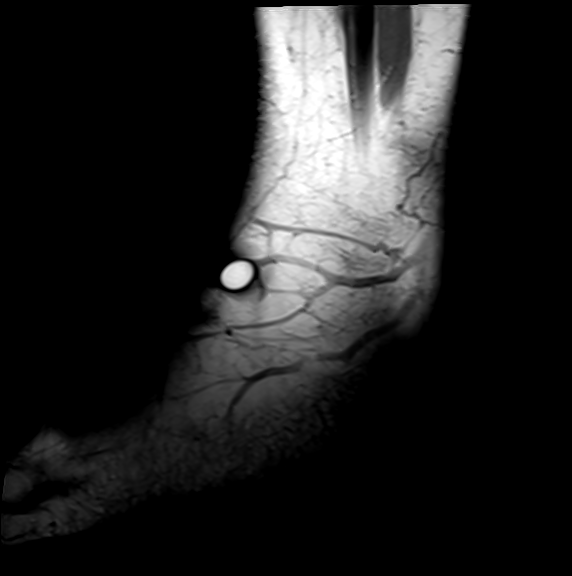
[im 7/20]
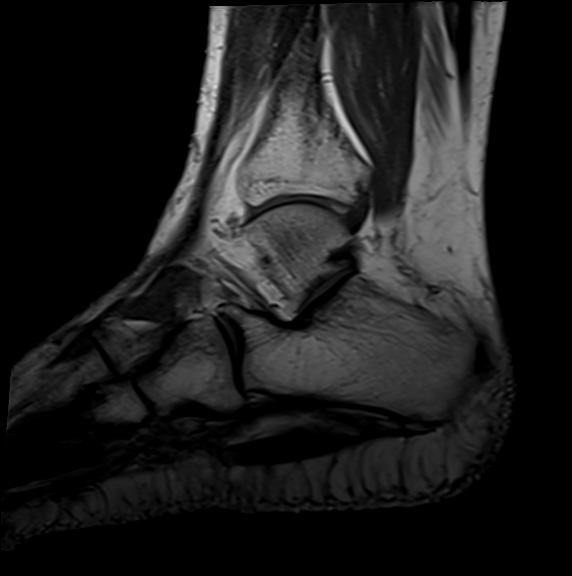
[im 13/20]
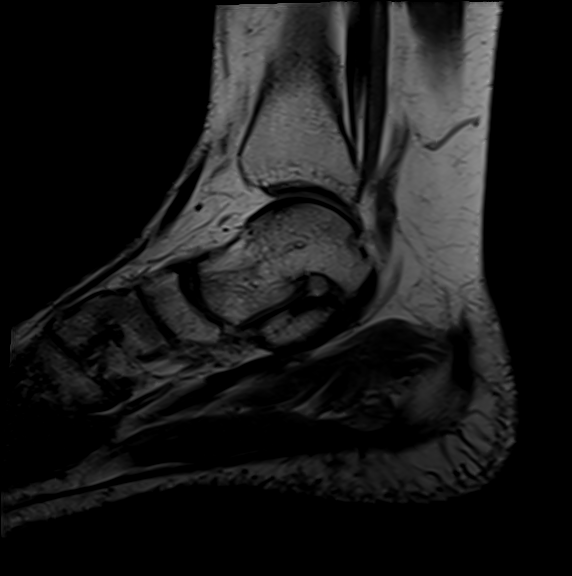
[im 20/20]
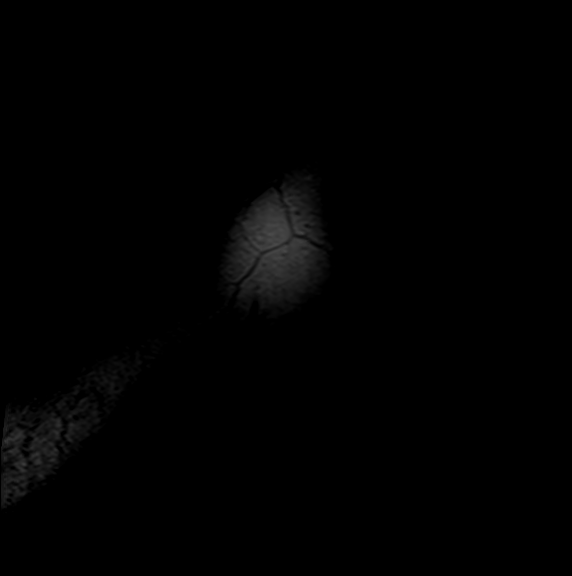

[Series 5001: cor pd_tse_fs · coronal · right · 3.0mm · 0.62mm/px · 7 of 30 slices shown]
[im 1/30]
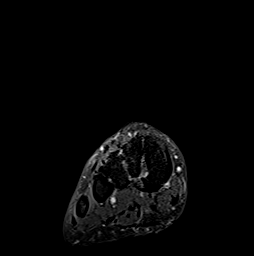
[im 5/30]
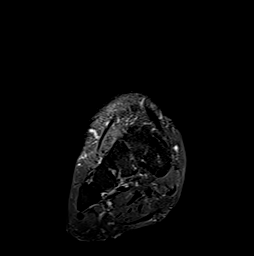
[im 10/30]
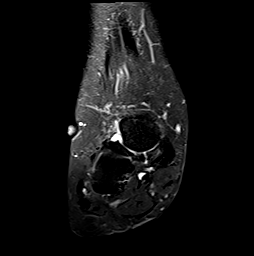
[im 15/30]
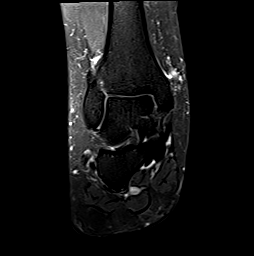
[im 20/30]
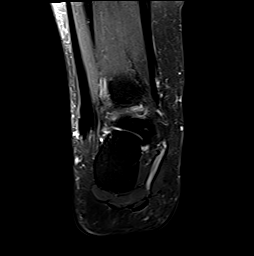
[im 25/30]
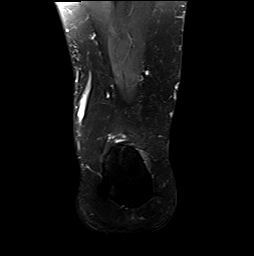
[im 30/30]
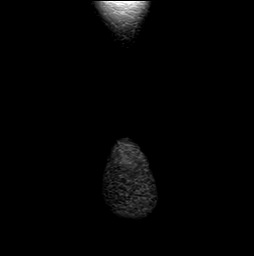

[Series 6001: T1 · coronal · right · 3.0mm · 0.59mm/px · 7 of 30 slices shown]
[im 1/30]
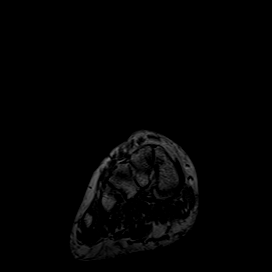
[im 5/30]
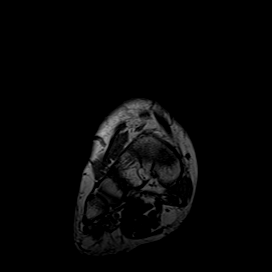
[im 10/30]
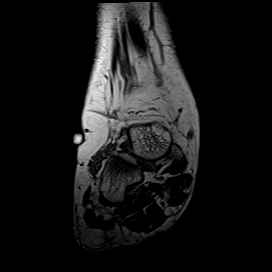
[im 15/30]
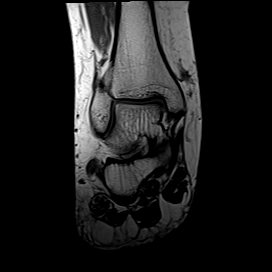
[im 20/30]
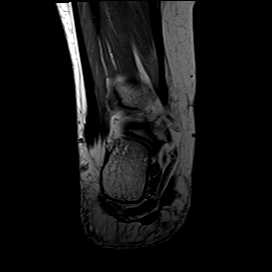
[im 25/30]
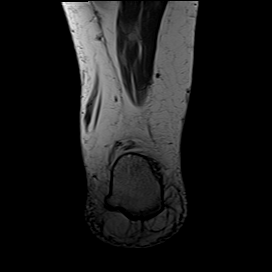
[im 30/30]
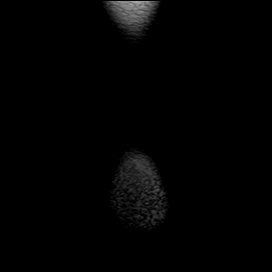

[Series 7001: axial t2_tse_fs · axial · right · 3.5mm · 0.62mm/px · z∈[-87,+13]mm · 6 of 25 slices shown]
[im 1/25]
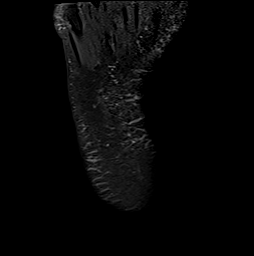
[im 5/25]
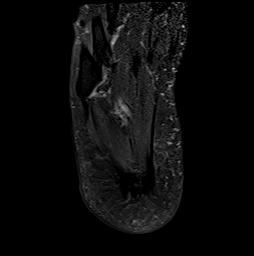
[im 10/25]
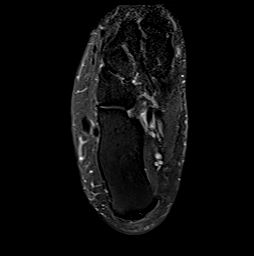
[im 15/25]
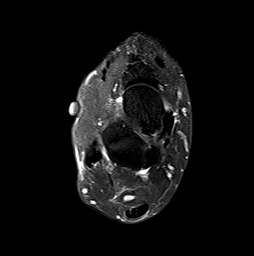
[im 20/25]
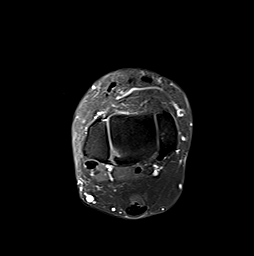
[im 25/25]
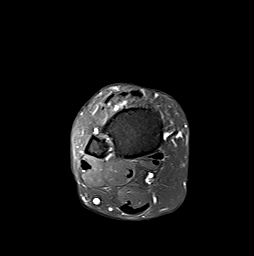

[Series 8001: pd_tse_tra · axial · right · 3.5mm · 0.62mm/px · z∈[-87,+13]mm · 6 of 25 slices shown]
[im 1/25]
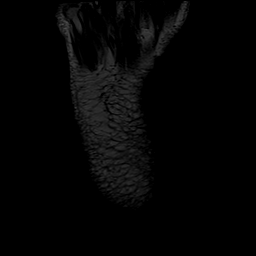
[im 5/25]
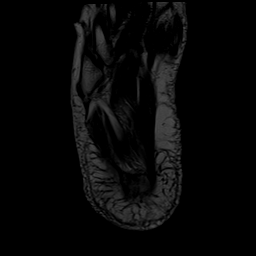
[im 10/25]
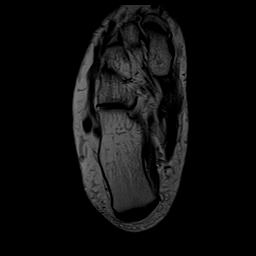
[im 15/25]
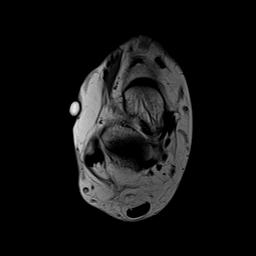
[im 20/25]
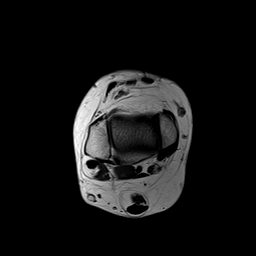
[im 25/25]
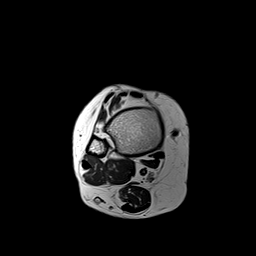

[Series 9001: obli pd_tse_fs · oblique · right · 3.0mm · 0.62mm/px · 6 of 25 slices shown]
[im 1/25]
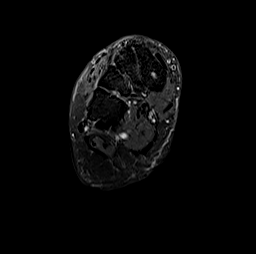
[im 5/25]
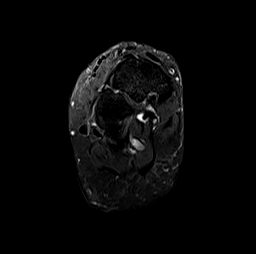
[im 10/25]
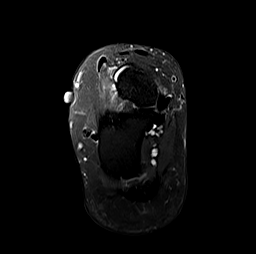
[im 15/25]
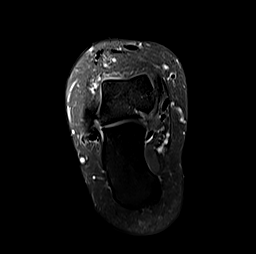
[im 20/25]
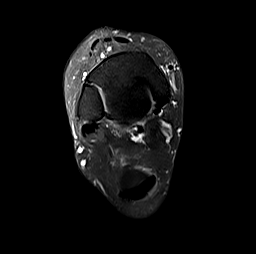
[im 25/25]
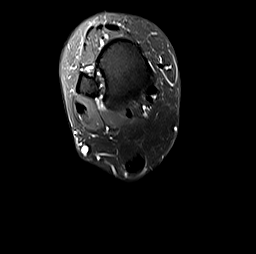

[40 of 40 positions shown; findings below may reference images not displayed]

Técnica:
Foram realizadas sequências multiplanares com ponderações em T1 e T2/DP, com e sem supressão do sinal
da gordura.
Relatório:
Tendinopatia do calcâneo caracterizada por espessamento e heterogeneidade tendínea. Associa peritendinite.
RESSONÂNCIA MAGNÉTICA DO TORNOZELO DIREITO
Destaca-se proeminência do processo posterossuperior do calcâneo, com discreto edema medular local, além
de distensão líquida da bursa retrocalcaneana profunda, inferindo bursite. O conjunto desses achados é
compatível com a tríade de [place]. 
Tendinopatia do fibular curto, destacando-se rotura parcial longitudinal / split tendíneo em sua porção
retromaleolar. 
Moderada atrofia e lipossubstituição do ventre muscular abdutor do quinto dedo. 
Demais estruturas ósseas com morfologia e intensidade de sinal preservadas. A superfície articular do domus
talar apresenta-se lisa e regular, com cartilagem articular íntegra.
Ausência de derrame articular ou sinovite.
Ligamentos dos complexos lateral e medial íntegros.
Os ligamentos da sindesmose tibiofibular distal não apresentam alterações.
Fáscia plantar com espessura mantida e sem alterações.
Demais tendões e ventres musculares avaliados sem anormalidades.
Impressão:
Tendinopatia do calcâneo. Associa peritendinite. Destaca-se proeminência do processo posterossuperior do
calcâneo, com discreto edema medular local, além de distensão líquida da bursa retrocalcaneana profunda,
inferindo bursite. O conjunto desses achados é compatível com a tríade de Haglund. 
Tendinopatia do fibular curto, destacando-se rotura parcial longitudinal / split tendíneo em sua porção
retromaleolar. 
[facility]
Moderada atrofia e lipossubstituição do ventre muscular abdutor do quinto dedo. 
[employer]

## 2022-08-15 ENCOUNTER — Encounter: Admit: 2022-08-15 | Payer: PRIVATE HEALTH INSURANCE | Primary: Internal Medicine

## 2022-08-22 ENCOUNTER — Encounter: Admit: 2022-08-22 | Payer: Medicare (Managed Care) | Primary: Internal Medicine

## 2022-08-22 ENCOUNTER — Encounter: Admit: 2022-08-22 | Payer: PRIVATE HEALTH INSURANCE | Primary: Internal Medicine

## 2022-08-29 ENCOUNTER — Encounter: Admit: 2022-08-29 | Payer: PRIVATE HEALTH INSURANCE | Attending: Family | Primary: Internal Medicine

## 2022-08-29 ENCOUNTER — Inpatient Hospital Stay: Admit: 2022-08-29 | Discharge: 2022-08-29 | Payer: PRIVATE HEALTH INSURANCE | Primary: Internal Medicine

## 2022-08-29 ENCOUNTER — Encounter: Admit: 2022-08-29 | Payer: Medicare (Managed Care) | Attending: Family | Primary: Internal Medicine

## 2022-08-29 DIAGNOSIS — R7303 Prediabetes: Secondary | ICD-10-CM

## 2022-08-29 DIAGNOSIS — E7800 Pure hypercholesterolemia, unspecified: Secondary | ICD-10-CM

## 2022-08-29 LAB — HEPATIC FUNCTION PANEL
BKR A/G RATIO: 1.8 (ref 1.0–2.2)
BKR ALANINE AMINOTRANSFERASE (ALT): 19 U/L — ABNORMAL HIGH (ref 10–35)
BKR ALBUMIN: 4.5 g/dL (ref 3.6–4.9)
BKR ALKALINE PHOSPHATASE: 57 U/L (ref 9–122)
BKR ASPARTATE AMINOTRANSFERASE (AST): 29 U/L (ref 10–35)
BKR AST/ALT RATIO: 1.5
BKR BILIRUBIN DIRECT: <0.2 mg/dL (ref <=0.3)
BKR BILIRUBIN TOTAL: 0.5 mg/dL (ref 8–<=1.2)
BKR GLOBULIN: 2.5 g/dL (ref 2.3–3.5)
BKR PROTEIN TOTAL: 7 g/dL (ref 6.6–8.7)

## 2022-08-29 LAB — HEMOGLOBIN A1C
BKR ESTIMATED AVERAGE GLUCOSE: 126 mg/dL
BKR HEMOGLOBIN A1C: 6 % — ABNORMAL HIGH (ref 4.0–5.6)

## 2022-08-29 LAB — LIPID PANEL
BKR CHOLESTEROL/HDL RATIO: 2.9 (ref 0.0–5.0)
BKR CHOLESTEROL: 196 mg/dL
BKR HDL CHOLESTEROL: 68 mg/dL (ref >=40–4.9)
BKR LDL CHOLESTEROL SAMPSON CALCULATED: 115 mg/dL — ABNORMAL HIGH
BKR TRIGLYCERIDES: 70 mg/dL

## 2022-08-29 LAB — GLUCOSE     (BH GH LMW Q YH): BKR GLUCOSE: 90 mg/dL (ref 70–100)

## 2022-08-30 ENCOUNTER — Encounter: Admit: 2022-08-30 | Payer: PRIVATE HEALTH INSURANCE | Attending: Family | Primary: Internal Medicine

## 2022-08-30 MED ORDER — ROSUVASTATIN 5 MG TABLET
5 | ORAL_TABLET | ORAL | 2 refills | 90.00000 days | Status: AC
Start: 2022-08-30 — End: 2022-11-07

## 2022-09-06 ENCOUNTER — Encounter: Admit: 2022-09-06 | Payer: PRIVATE HEALTH INSURANCE | Primary: Internal Medicine

## 2022-09-06 ENCOUNTER — Encounter: Admit: 2022-09-06 | Payer: Medicare (Managed Care) | Primary: Internal Medicine

## 2022-10-02 ENCOUNTER — Encounter: Admit: 2022-10-02 | Payer: PRIVATE HEALTH INSURANCE | Primary: Internal Medicine

## 2022-10-04 ENCOUNTER — Telehealth: Admit: 2022-10-04 | Payer: PRIVATE HEALTH INSURANCE | Primary: Internal Medicine

## 2022-11-04 ENCOUNTER — Encounter: Admit: 2022-11-04 | Payer: PRIVATE HEALTH INSURANCE | Attending: Internal Medicine | Primary: Internal Medicine

## 2022-11-05 ENCOUNTER — Encounter: Admit: 2022-11-05 | Payer: PRIVATE HEALTH INSURANCE | Attending: Family | Primary: Internal Medicine

## 2022-11-07 ENCOUNTER — Encounter: Admit: 2022-11-07 | Payer: PRIVATE HEALTH INSURANCE | Attending: Internal Medicine | Primary: Internal Medicine

## 2022-11-07 MED ORDER — ROSUVASTATIN 5 MG TABLET
5 | ORAL_TABLET | ORAL | 1 refills | 90.00000 days | Status: AC
Start: 2022-11-07 — End: 2023-01-26

## 2022-11-10 MED ORDER — AMLODIPINE 5 MG TABLET
5 | ORAL_TABLET | Freq: Every day | ORAL | 2 refills | 90.00000 days | Status: AC
Start: 2022-11-10 — End: 2023-05-15

## 2022-11-23 ENCOUNTER — Encounter: Admit: 2022-11-23 | Payer: PRIVATE HEALTH INSURANCE | Primary: Internal Medicine

## 2022-11-28 ENCOUNTER — Encounter: Admit: 2022-11-28 | Payer: Medicare (Managed Care) | Attending: Gastroenterology | Primary: Internal Medicine

## 2023-01-18 ENCOUNTER — Encounter: Admit: 2023-01-18 | Payer: PRIVATE HEALTH INSURANCE | Primary: Internal Medicine

## 2023-01-26 ENCOUNTER — Encounter: Admit: 2023-01-26 | Payer: PRIVATE HEALTH INSURANCE | Attending: Internal Medicine | Primary: Internal Medicine

## 2023-01-26 MED ORDER — ROSUVASTATIN 5 MG TABLET
5 | ORAL_TABLET | ORAL | 2 refills | 90.00000 days | Status: AC
Start: 2023-01-26 — End: 2023-07-03

## 2023-02-05 ENCOUNTER — Encounter: Admit: 2023-02-05 | Payer: PRIVATE HEALTH INSURANCE | Attending: Gastroenterology | Primary: Internal Medicine

## 2023-02-06 ENCOUNTER — Encounter: Admit: 2023-02-06 | Payer: PRIVATE HEALTH INSURANCE | Attending: Gastroenterology | Primary: Internal Medicine

## 2023-02-06 ENCOUNTER — Encounter: Admit: 2023-02-06 | Payer: Medicare (Managed Care) | Attending: Gastroenterology | Primary: Internal Medicine

## 2023-02-06 MED ORDER — SODIUM,POTASSIUM,MAG SULFATES 17.5 GRAM-3.13 GRAM-1.6 GRAM ORAL SOLN
17.5-3.13-1.6 | ORAL | 1 refills | 2.00000 days | Status: AC
Start: 2023-02-06 — End: 2023-04-27

## 2023-02-10 ENCOUNTER — Telehealth: Admit: 2023-02-10 | Payer: PRIVATE HEALTH INSURANCE | Attending: Gastroenterology | Primary: Internal Medicine

## 2023-02-10 ENCOUNTER — Encounter: Admit: 2023-02-10 | Payer: PRIVATE HEALTH INSURANCE | Attending: Gastroenterology | Primary: Internal Medicine

## 2023-02-27 ENCOUNTER — Encounter: Admit: 2023-02-27 | Payer: PRIVATE HEALTH INSURANCE | Primary: Internal Medicine

## 2023-02-27 ENCOUNTER — Ambulatory Visit: Admit: 2023-02-27 | Payer: PRIVATE HEALTH INSURANCE | Attending: Internal Medicine | Primary: Internal Medicine

## 2023-02-27 ENCOUNTER — Inpatient Hospital Stay: Admit: 2023-02-27 | Discharge: 2023-02-27 | Payer: PRIVATE HEALTH INSURANCE | Primary: Internal Medicine

## 2023-02-27 ENCOUNTER — Ambulatory Visit: Admit: 2023-02-27 | Payer: Medicare (Managed Care) | Attending: Internal Medicine | Primary: Internal Medicine

## 2023-02-27 ENCOUNTER — Encounter: Admit: 2023-02-27 | Payer: PRIVATE HEALTH INSURANCE | Attending: Internal Medicine | Primary: Internal Medicine

## 2023-02-27 DIAGNOSIS — Z0000 Encounter for general adult medical examination without abnormal findings: Secondary | ICD-10-CM

## 2023-02-27 DIAGNOSIS — M810 Age-related osteoporosis without current pathological fracture: Secondary | ICD-10-CM

## 2023-02-27 DIAGNOSIS — E785 Hyperlipidemia, unspecified: Secondary | ICD-10-CM

## 2023-02-27 DIAGNOSIS — R7303 Prediabetes: Secondary | ICD-10-CM

## 2023-02-27 DIAGNOSIS — L659 Nonscarring hair loss, unspecified: Secondary | ICD-10-CM

## 2023-02-27 LAB — URINALYSIS WITH CULTURE REFLEX      (BH LMW YH)
BKR BILIRUBIN, UA: NEGATIVE
BKR BLOOD, UA: NEGATIVE
BKR GLUCOSE, UA: NEGATIVE
BKR KETONES, UA: NEGATIVE
BKR LEUKOCYTE ESTERASE, UA: NEGATIVE
BKR NITRITE, UA: NEGATIVE
BKR PH, UA: 6.5 (ref 5.5–7.5)
BKR PROTEIN, UA: NEGATIVE
BKR SPECIFIC GRAVITY, UA: 1.017 (ref 1.005–1.030)
BKR UROBILINOGEN, UA (MG/DL): <2 mg/dL (ref <=2.0)

## 2023-02-27 LAB — UA REFLEX CULTURE

## 2023-03-22 ENCOUNTER — Encounter
Admit: 2023-03-22 | Payer: PRIVATE HEALTH INSURANCE | Attending: Vascular and Interventional Radiology | Primary: Internal Medicine

## 2023-03-30 ENCOUNTER — Telehealth: Admit: 2023-03-30 | Payer: PRIVATE HEALTH INSURANCE | Attending: Cardiovascular Disease | Primary: Internal Medicine

## 2023-04-03 ENCOUNTER — Ambulatory Visit: Admit: 2023-04-03 | Payer: Medicare (Managed Care) | Primary: Internal Medicine

## 2023-04-03 ENCOUNTER — Encounter: Admit: 2023-04-03 | Payer: PRIVATE HEALTH INSURANCE | Attending: Internal Medicine | Primary: Internal Medicine

## 2023-04-19 ENCOUNTER — Telehealth: Admit: 2023-04-19 | Payer: PRIVATE HEALTH INSURANCE | Attending: Internal Medicine | Primary: Internal Medicine

## 2023-04-27 ENCOUNTER — Encounter
Admit: 2023-04-27 | Payer: PRIVATE HEALTH INSURANCE | Attending: Vascular and Interventional Radiology | Primary: Internal Medicine

## 2023-04-27 ENCOUNTER — Inpatient Hospital Stay: Admit: 2023-04-27 | Discharge: 2023-04-27 | Payer: PRIVATE HEALTH INSURANCE | Primary: Internal Medicine

## 2023-04-27 ENCOUNTER — Encounter: Admit: 2023-04-27 | Payer: PRIVATE HEALTH INSURANCE | Primary: Internal Medicine

## 2023-04-27 MED ORDER — SODIUM CHLORIDE 0.9 % IV BOLUS FROM BAG (ZERO CHARGE)
0.9 | INTRAVENOUS | Status: DC
Start: 2023-04-27 — End: 2023-04-29

## 2023-04-27 MED ORDER — SODIUM CHLORIDE 0.9 % INTRAVENOUS SOLUTION
INTRAVENOUS | Status: DC
Start: 2023-04-27 — End: 2023-04-29

## 2023-04-27 MED ORDER — SODIUM CHLORIDE 0.9 % INTRAVENOUS SOLUTION
INTRAVENOUS | Status: DC
Start: 2023-04-27 — End: 2023-04-29
  Administered 2023-04-27: 07:00:00 via INTRAVENOUS

## 2023-04-28 ENCOUNTER — Encounter: Admit: 2023-04-28 | Payer: PRIVATE HEALTH INSURANCE | Attending: Gastroenterology | Primary: Internal Medicine

## 2023-04-28 ENCOUNTER — Encounter: Admit: 2023-04-28 | Payer: PRIVATE HEALTH INSURANCE | Attending: Internal Medicine | Primary: Internal Medicine

## 2023-05-14 ENCOUNTER — Encounter: Admit: 2023-05-14 | Payer: PRIVATE HEALTH INSURANCE | Attending: Internal Medicine | Primary: Internal Medicine

## 2023-05-15 MED ORDER — AMLODIPINE 5 MG TABLET
5 | ORAL_TABLET | Freq: Every day | ORAL | 2 refills | 90.00000 days | Status: AC
Start: 2023-05-15 — End: 2023-10-09

## 2023-06-30 ENCOUNTER — Encounter: Admit: 2023-06-30 | Payer: PRIVATE HEALTH INSURANCE | Attending: Internal Medicine | Primary: Internal Medicine

## 2023-07-03 MED ORDER — ROSUVASTATIN 5 MG TABLET
5 | ORAL_TABLET | ORAL | 2 refills | 90.00000 days | Status: AC
Start: 2023-07-03 — End: 2024-02-12

## 2023-08-17 ENCOUNTER — Encounter: Admit: 2023-08-17 | Payer: PRIVATE HEALTH INSURANCE | Primary: Internal Medicine

## 2023-08-28 ENCOUNTER — Encounter: Admit: 2023-08-28 | Payer: PRIVATE HEALTH INSURANCE | Primary: Internal Medicine

## 2023-08-28 ENCOUNTER — Encounter: Admit: 2023-08-28 | Payer: Medicare (Managed Care) | Primary: Internal Medicine

## 2023-09-04 ENCOUNTER — Encounter: Admit: 2023-09-04 | Payer: Medicare (Managed Care) | Attending: Family | Primary: Internal Medicine

## 2023-09-04 ENCOUNTER — Encounter: Admit: 2023-09-04 | Payer: PRIVATE HEALTH INSURANCE | Attending: Family | Primary: Internal Medicine

## 2023-09-04 ENCOUNTER — Inpatient Hospital Stay: Admit: 2023-09-04 | Discharge: 2023-09-04 | Payer: PRIVATE HEALTH INSURANCE | Primary: Internal Medicine

## 2023-09-04 DIAGNOSIS — R7303 Prediabetes: Secondary | ICD-10-CM

## 2023-09-04 LAB — LIPID PANEL
BKR CHOLESTEROL/HDL RATIO: 2.4 (ref 0.0–5.0)
BKR CHOLESTEROL: 183 mg/dL
BKR HDL CHOLESTEROL: 75 mg/dL (ref >=40)
BKR LDL CHOLESTEROL SAMPSON CALCULATED: 99 mg/dL
BKR TRIGLYCERIDES: 46 mg/dL

## 2023-09-04 LAB — BILIRUBIN, DIRECT: BKR BILIRUBIN DIRECT: <0.2 mg/dL (ref <=0.3)

## 2023-10-09 ENCOUNTER — Telehealth: Admit: 2023-10-09 | Payer: PRIVATE HEALTH INSURANCE | Attending: Internal Medicine | Primary: Internal Medicine

## 2023-10-09 ENCOUNTER — Encounter: Admit: 2023-10-09 | Payer: PRIVATE HEALTH INSURANCE | Attending: Family | Primary: Internal Medicine

## 2023-10-09 MED ORDER — AMLODIPINE 5 MG TABLET
5 | ORAL_TABLET | ORAL | 2 refills | 90.00000 days | Status: AC
Start: 2023-10-09 — End: 2024-01-12

## 2024-01-12 ENCOUNTER — Encounter: Admit: 2024-01-12 | Payer: PRIVATE HEALTH INSURANCE | Attending: Internal Medicine | Primary: Internal Medicine

## 2024-01-12 MED ORDER — AMLODIPINE 5 MG TABLET
5 | ORAL_TABLET | ORAL | 2 refills | 90.00000 days | Status: AC
Start: 2024-01-12 — End: 2024-07-08

## 2024-02-12 ENCOUNTER — Encounter: Admit: 2024-02-12 | Payer: PRIVATE HEALTH INSURANCE | Attending: Internal Medicine | Primary: Internal Medicine

## 2024-02-12 MED ORDER — ROSUVASTATIN 5 MG TABLET
5 | ORAL_TABLET | ORAL | 2 refills | 90.00000 days | Status: AC
Start: 2024-02-12 — End: 2024-07-08

## 2024-03-07 IMAGING — MR [place]
7 of 10 series · 33 of 40 positions shown · non-contrast
Comparison: none

[Series 4001: ax t2_tse_fs · axial · left · 3.5mm · 0.56mm/px · z∈[-75,-5]mm · 5 of 20 slices shown]
[im 1/20]
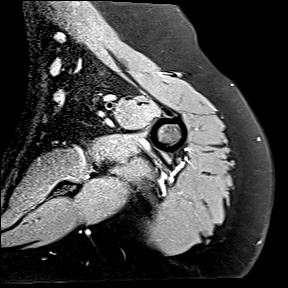
[im 5/20]
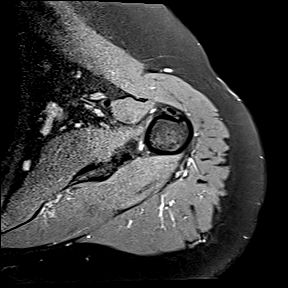
[im 10/20]
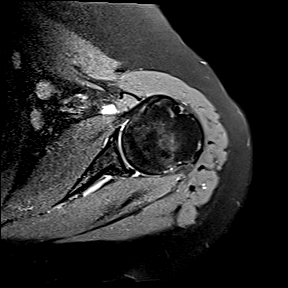
[im 15/20]
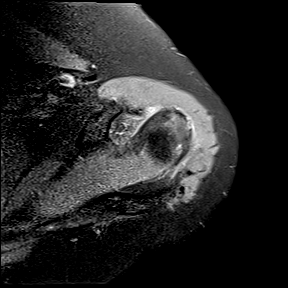
[im 20/20]
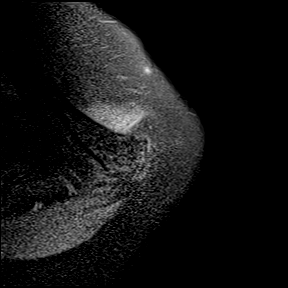

[Series 5001: cor t2_tse_fs · oblique · left · 3.0mm · 0.52mm/px · 5 of 21 slices shown]
[im 1/21]
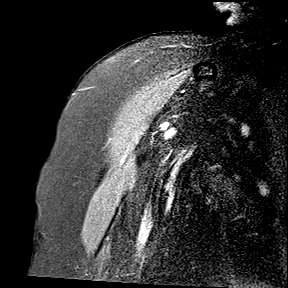
[im 6/21]
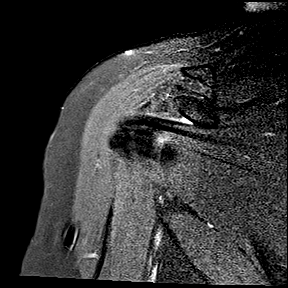
[im 11/21]
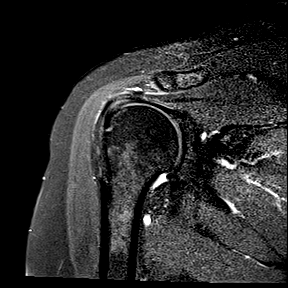
[im 16/21]
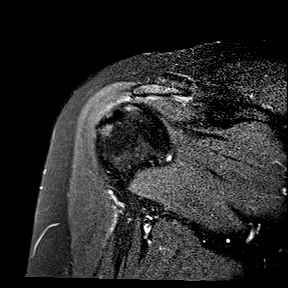
[im 21/21]
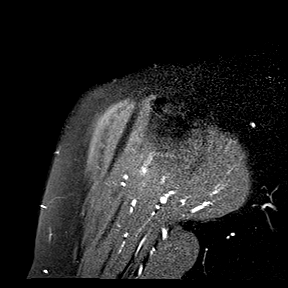

[Series 6001: t2_tse_cor_336 · oblique · left · 4.0mm · 0.47mm/px · 4 of 16 slices shown]
[im 1/16]
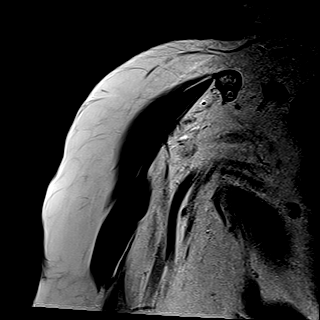
[im 6/16]
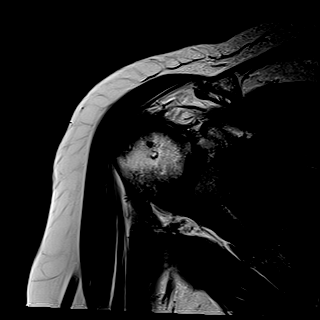
[im 11/16]
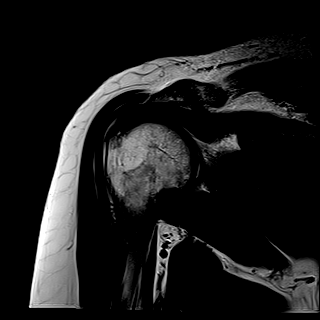
[im 16/16]
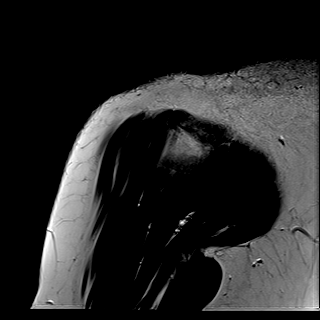

[Series 7001: sag_t1_quiet · oblique · left · 3.5mm · 0.56mm/px · 5 of 20 slices shown]
[im 1/20]
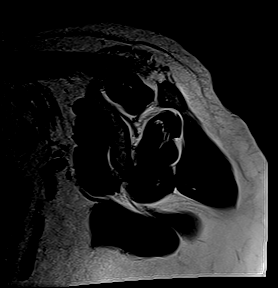
[im 5/20]
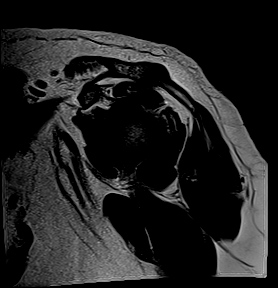
[im 10/20]
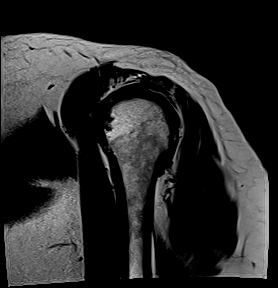
[im 15/20]
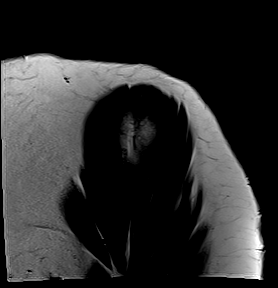
[im 20/20]
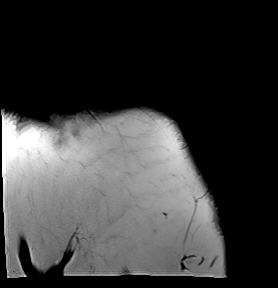

[Series 8001: sag_tse_fs · oblique · left · 3.5mm · 0.56mm/px · 5 of 20 slices shown]
[im 1/20]
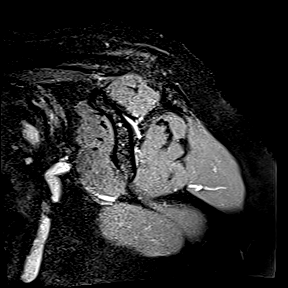
[im 5/20]
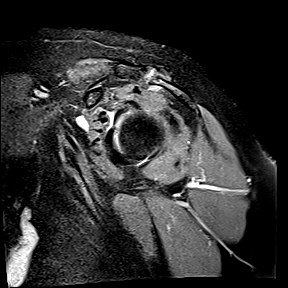
[im 10/20]
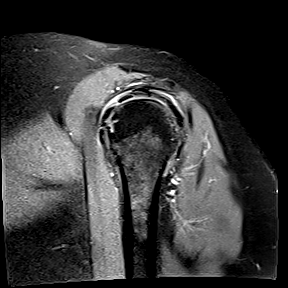
[im 15/20]
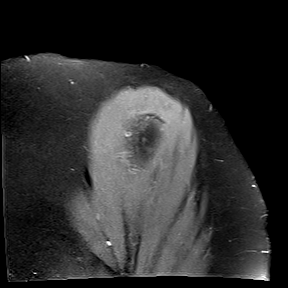
[im 20/20]
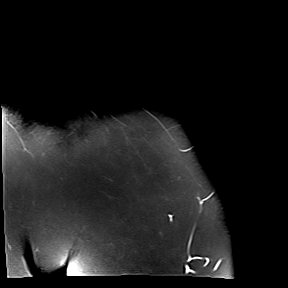

[cor t2_tse_fs_(person_name) · oblique · left · 3.0mm · 0.29mm/px · 5 of 21 slices shown]
[im 1/21]
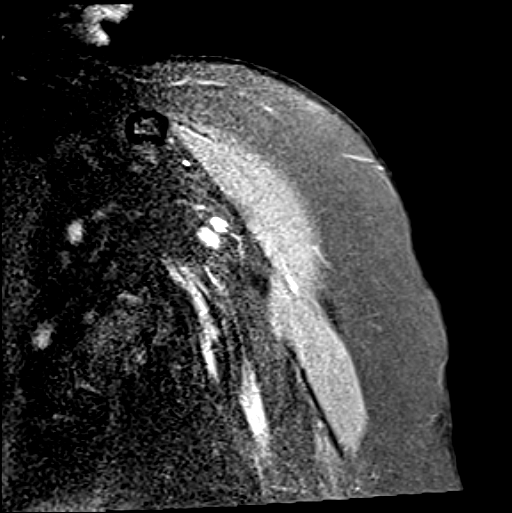
[im 6/21]
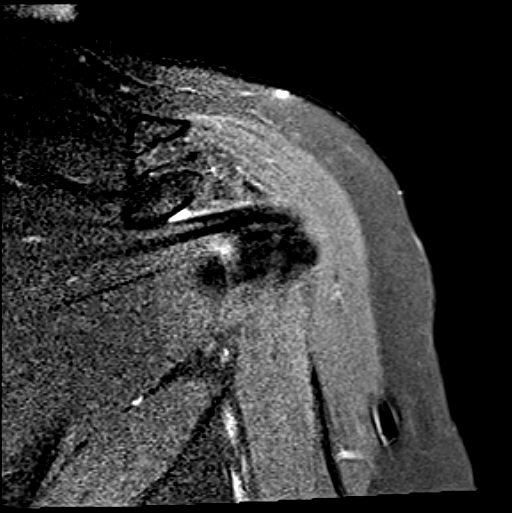
[im 11/21]
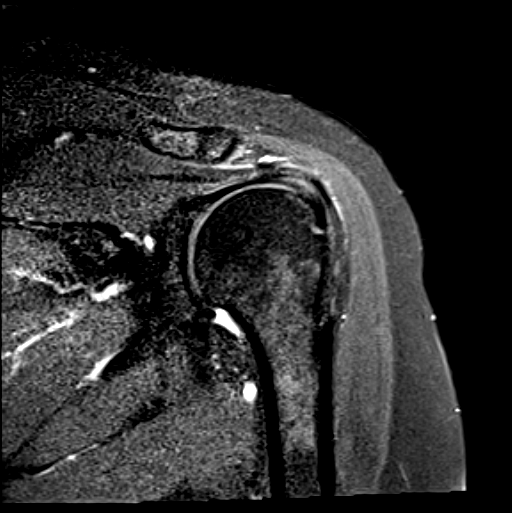
[im 16/21]
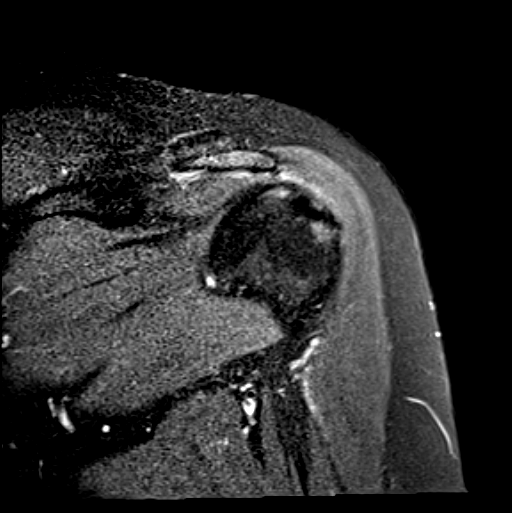
[im 21/21]
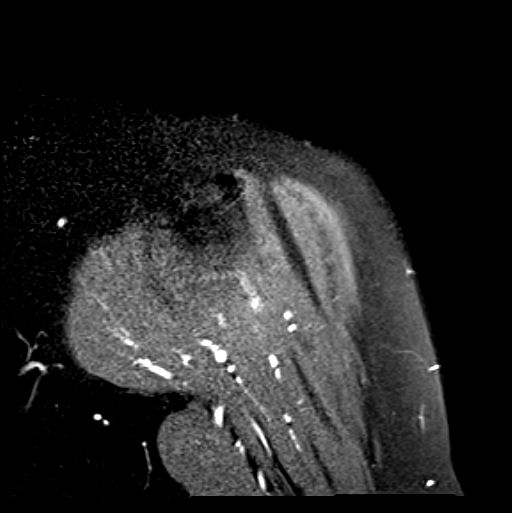

[t2_tse_cor_336_(person_name) · oblique · left · 4.0mm · 0.29mm/px · 4 of 16 slices shown]
[im 1/16]
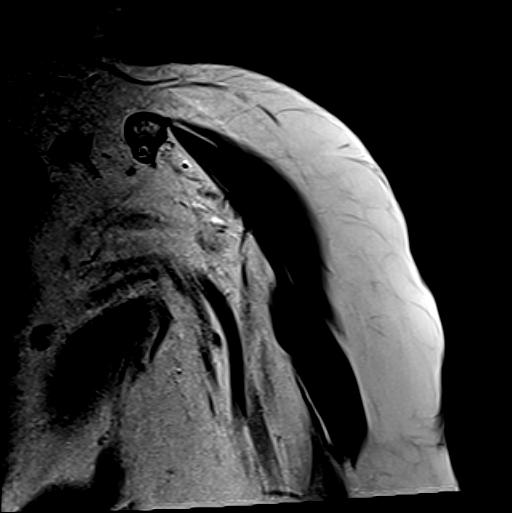
[im 6/16]
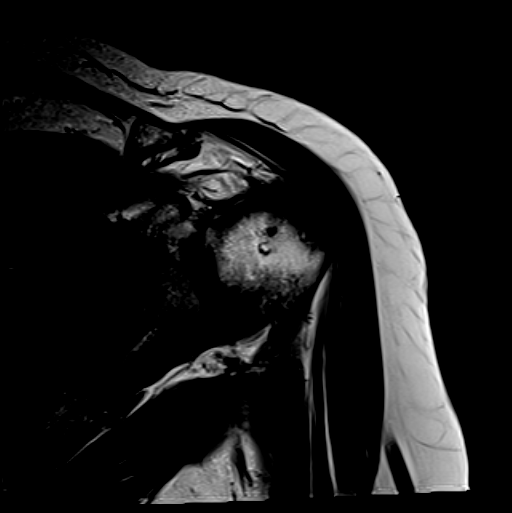
[im 11/16]
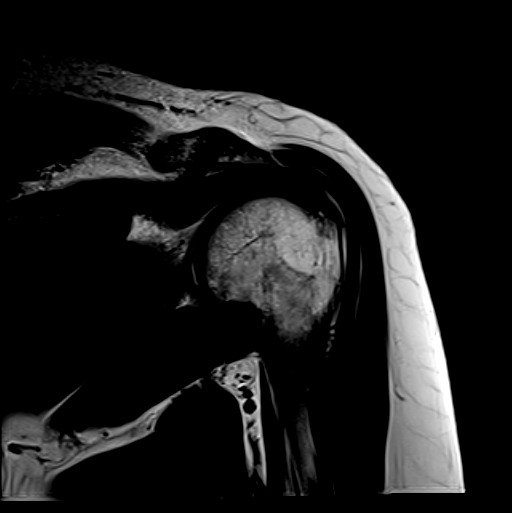
[im 16/16]
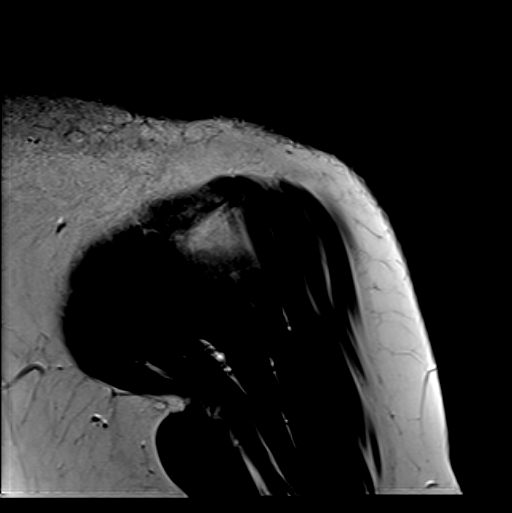

[33 of 40 positions shown; findings below may reference images not displayed]

Técnica:
Exame realizado com sequências multiplanares ponderadas em T1, T2/DP e T2 com supressão de gordura.
Relatório:
Alterações degenerativas discretas na articulação acromioclavicular.
INFORMAÇÃO ADICIONAL: -
Tendinopatia do supraespinal e infraespinal, sem roturas.
RESSONÂNCIA MAGNÉTICA DO OMBRO ESQUERDO
Irregularidade dos contornos e edema ósseo medular no tubérculo maior do úmero, adjacente a inserção
tendínea.
Demais tendões que compõem o manguito rotador íntegros.
Bursa subacromial / subdeltoide apresentando aspecto usual.
Tendão da cabeça longa do bíceps com espessura mantida e sem alterações.
Ventres musculares eutróficos e sem anormalidades.
Lábio da glenoide com contornos regulares e aspecto habitual.
Ausência de derrame articular ou sinovite.
Revestimentos condrais da articulação glenoumeral com espessura mantida e sem anormalidades.
Demais estruturas ósseas sem alterações.
Demais estruturas periarticulares sem alterações.
Impressão:
Alterações degenerativas discretas na articulação acromioclavicular.
Tendinopatia do supraespinal e infraespinal, sem roturas.
Irregularidade dos contornos e edema ósseo medular no tubérculo maior do úmero, adjacente a inserção
tendínea.
[employer]

## 2024-03-21 ENCOUNTER — Encounter: Admit: 2024-03-21 | Payer: PRIVATE HEALTH INSURANCE | Attending: Internal Medicine | Primary: Internal Medicine

## 2024-04-01 ENCOUNTER — Encounter: Admit: 2024-04-01 | Payer: PRIVATE HEALTH INSURANCE | Attending: Internal Medicine | Primary: Internal Medicine

## 2024-04-01 ENCOUNTER — Inpatient Hospital Stay: Admit: 2024-04-01 | Discharge: 2024-04-01 | Payer: Medicare (Managed Care) | Primary: Internal Medicine

## 2024-04-01 DIAGNOSIS — R7303 Prediabetes: Secondary | ICD-10-CM

## 2024-04-01 DIAGNOSIS — E785 Hyperlipidemia, unspecified: Secondary | ICD-10-CM

## 2024-04-01 DIAGNOSIS — M810 Age-related osteoporosis without current pathological fracture: Secondary | ICD-10-CM

## 2024-04-01 DIAGNOSIS — Z8349 Family history of other endocrine, nutritional and metabolic diseases: Secondary | ICD-10-CM

## 2024-04-01 DIAGNOSIS — Z0000 Encounter for general adult medical examination without abnormal findings: Secondary | ICD-10-CM

## 2024-04-01 LAB — COMPREHENSIVE METABOLIC PANEL
BKR A/G RATIO: 1.8 (ref 1.0–2.2)
BKR ALANINE AMINOTRANSFERASE (ALT): 19 U/L (ref 10–35)
BKR ALBUMIN: 4.3 g/dL (ref 3.6–5.1)
BKR ALKALINE PHOSPHATASE: 57 U/L (ref 9–122)
BKR ANION GAP: 10 (ref 7–17)
BKR ASPARTATE AMINOTRANSFERASE (AST): 20 U/L (ref 10–35)
BKR AST/ALT RATIO: 1.1
BKR BILIRUBIN TOTAL: 0.4 mg/dL (ref <=1.2)
BKR BLOOD UREA NITROGEN: 16 mg/dL (ref 8–23)
BKR BUN / CREAT RATIO: 23.5 — ABNORMAL HIGH (ref 8.0–23.0)
BKR CALCIUM: 9.6 mg/dL (ref 8.8–10.2)
BKR CHLORIDE: 103 mmol/L (ref 98–107)
BKR CO2: 27 mmol/L (ref 20–30)
BKR CREATININE DELTA: -0.06
BKR CREATININE: 0.68 mg/dL (ref 0.40–1.30)
BKR EGFR, CREATININE (CKD-EPI 2021): >60 mL/min/{1.73_m2} (ref >=60)
BKR GLOBULIN: 2.4 g/dL (ref 2.0–3.9)
BKR GLUCOSE: 102 mg/dL — ABNORMAL HIGH (ref 70–100)
BKR POTASSIUM: 4.2 mmol/L (ref 3.3–5.3)
BKR PROTEIN TOTAL: 6.7 g/dL (ref 5.9–8.3)
BKR SODIUM: 140 mmol/L (ref 136–144)

## 2024-04-01 LAB — LIPID PANEL
BKR CHOLESTEROL/HDL RATIO: 2.2 (ref 0.0–5.0)
BKR CHOLESTEROL: 173 mg/dL
BKR HDL CHOLESTEROL: 77 mg/dL (ref >=40)
BKR LDL CHOLESTEROL SAMPSON CALCULATED: 84 mg/dL
BKR TRIGLYCERIDES: 65 mg/dL

## 2024-04-01 LAB — CBC WITH AUTO DIFFERENTIAL
BKR WAM ABSOLUTE IMMATURE GRANULOCYTES.: 0.02 x 1000/ÂµL (ref 0.00–0.30)
BKR WAM ABSOLUTE LYMPHOCYTE COUNT.: 1.43 x 1000/ÂµL (ref 0.60–3.70)
BKR WAM ABSOLUTE NRBC: 0 x 1000/ÂµL (ref 0.00–1.00)
BKR WAM ANC (ABSOLUTE NEUTROPHIL COUNT): 2.77 x 1000/ÂµL (ref 2.00–7.60)
BKR WAM BASOPHIL ABSOLUTE COUNT.: 0.05 x 1000/ÂµL (ref 0.00–1.00)
BKR WAM BASOPHILS: 1.1 % (ref 0.0–1.4)
BKR WAM EOSINOPHIL ABSOLUTE COUNT.: 0.12 x 1000/ÂµL (ref 0.00–1.00)
BKR WAM EOSINOPHILS: 2.5 % (ref 0.0–5.0)
BKR WAM HEMATOCRIT: 43.6 % (ref 35.00–45.00)
BKR WAM HEMOGLOBIN: 13.8 g/dL (ref 11.7–15.5)
BKR WAM IMMATURE GRANULOCYTES: 0.4 % (ref 0.0–1.0)
BKR WAM LYMPHOCYTES: 30.3 % (ref 17.0–50.0)
BKR WAM MCH: 30.5 pg (ref 27.0–33.0)
BKR WAM MCHC: 31.7 g/dL (ref 31.0–36.0)
BKR WAM MCV: 96.2 fL (ref 80.0–100.0)
BKR WAM MONOCYTE ABSOLUTE COUNT.: 0.33 x 1000/ÂµL (ref 0.00–1.00)
BKR WAM MONOCYTES: 7 % (ref 4.0–12.0)
BKR WAM MPV: 11.1 fL (ref 8.0–12.0)
BKR WAM NEUTROPHILS: 58.7 % (ref 39.0–72.0)
BKR WAM NUCLEATED RED BLOOD CELLS: 0 % (ref 0.0–1.0)
BKR WAM PLATELETS: 251 x1000/ÂµL (ref 150–420)
BKR WAM RDW-CV: 13.2 % (ref 11.0–15.0)
BKR WAM RED BLOOD CELL COUNT.: 4.53 M/ÂµL (ref 4.00–6.00)
BKR WAM WHITE BLOOD CELL COUNT: 4.7 x1000/ÂµL (ref 4.0–11.0)

## 2024-04-01 LAB — URINALYSIS WITH CULTURE REFLEX      (BH LMW YH)
BKR BILIRUBIN, UA: NEGATIVE
BKR BLOOD, UA: NEGATIVE
BKR GLUCOSE, UA: NEGATIVE
BKR KETONES, UA: NEGATIVE
BKR LEUKOCYTE ESTERASE, UA: NEGATIVE
BKR NITRITE, UA: NEGATIVE
BKR PH, UA: 7 (ref 5.5–7.5)
BKR PROTEIN, UA: NEGATIVE
BKR SPECIFIC GRAVITY, UA: 1.015 (ref 1.005–1.030)
BKR UROBILINOGEN, UA: <2 mg/dL (ref <=2.0)

## 2024-04-01 LAB — TSH W/REFLEX TO FT4     (BH GH LMW Q YH): BKR THYROID STIMULATING HORMONE: 1.22 u[IU]/mL

## 2024-04-01 LAB — MAGNESIUM: BKR MAGNESIUM: 1.9 mg/dL (ref 1.7–2.4)

## 2024-04-01 LAB — HEMOGLOBIN A1C
BKR ESTIMATED AVERAGE GLUCOSE: 117 mg/dL
BKR HEMOGLOBIN A1C: 5.7 % — ABNORMAL HIGH (ref 4.0–5.6)

## 2024-04-01 LAB — VITAMIN D, 25-HYDROXY: BKR VITAMIN D 25-HYDROXY: 56.8 ng/mL (ref 30.0–100.0)

## 2024-04-01 LAB — IRON AND TIBC
BKR IRON SATURATION: 26 % (ref 15–50)
BKR IRON: 77 ug/dL (ref 37–145)
BKR TOTAL IRON BINDING CAPACITY: 295 ug/dL (ref 250–450)

## 2024-04-01 LAB — UA REFLEX CULTURE

## 2024-04-01 LAB — BILIRUBIN, DIRECT: BKR BILIRUBIN DIRECT: 0.1 mg/dL (ref <=0.2)

## 2024-04-01 LAB — FERRITIN: BKR FERRITIN: 62 ng/mL (ref 13–150)

## 2024-05-27 ENCOUNTER — Inpatient Hospital Stay: Admit: 2024-05-27 | Discharge: 2024-05-27 | Payer: PRIVATE HEALTH INSURANCE | Primary: Internal Medicine

## 2024-05-27 DIAGNOSIS — R92323 Mammographic fibroglandular density, bilateral breasts: Secondary | ICD-10-CM

## 2024-05-27 DIAGNOSIS — Z0000 Encounter for general adult medical examination without abnormal findings: Principal | ICD-10-CM

## 2024-05-27 DIAGNOSIS — Z1231 Encounter for screening mammogram for malignant neoplasm of breast: Secondary | ICD-10-CM

## 2024-06-12 ENCOUNTER — Encounter: Admit: 2024-06-12 | Payer: PRIVATE HEALTH INSURANCE | Attending: Otolaryngology | Primary: Internal Medicine

## 2024-06-12 DIAGNOSIS — H905 Unspecified sensorineural hearing loss: Principal | ICD-10-CM

## 2024-07-01 ENCOUNTER — Inpatient Hospital Stay: Admit: 2024-07-01 | Discharge: 2024-07-01 | Payer: PRIVATE HEALTH INSURANCE | Primary: Internal Medicine

## 2024-07-01 DIAGNOSIS — H905 Unspecified sensorineural hearing loss: Principal | ICD-10-CM

## 2024-07-01 DIAGNOSIS — J323 Chronic sphenoidal sinusitis: Secondary | ICD-10-CM

## 2024-07-01 DIAGNOSIS — I6782 Cerebral ischemia: Secondary | ICD-10-CM

## 2024-07-01 MED ORDER — GADOTERATE MEGLUMINE 0.5 MMOL/ML (376.9 MG/ML) INTRAVENOUS SOLUTION
0.5 | Freq: Once | INTRAVENOUS | Status: CP | PRN
Start: 2024-07-01 — End: ?
  Administered 2024-07-01: 09:00:00 0.5 mL via INTRAVENOUS

## 2024-07-06 ENCOUNTER — Encounter: Admit: 2024-07-06 | Payer: PRIVATE HEALTH INSURANCE | Attending: Family | Primary: Internal Medicine

## 2024-07-06 ENCOUNTER — Encounter: Admit: 2024-07-06 | Payer: PRIVATE HEALTH INSURANCE | Attending: Internal Medicine | Primary: Internal Medicine

## 2024-07-08 MED ORDER — AMLODIPINE 5 MG TABLET
5 | ORAL_TABLET | Freq: Every day | ORAL | 2 refills | 90.00000 days | Status: AC
Start: 2024-07-08 — End: 2024-07-24

## 2024-07-08 MED ORDER — ROSUVASTATIN 5 MG TABLET
5 | ORAL_TABLET | ORAL | 2 refills | 30.00000 days | Status: AC
Start: 2024-07-08 — End: ?

## 2024-07-10 ENCOUNTER — Encounter: Admit: 2024-07-10 | Payer: PRIVATE HEALTH INSURANCE | Attending: Otolaryngology | Primary: Internal Medicine

## 2024-07-10 DIAGNOSIS — D333 Benign neoplasm of cranial nerves: Principal | ICD-10-CM

## 2024-07-17 ENCOUNTER — Encounter: Admit: 2024-07-17 | Payer: PRIVATE HEALTH INSURANCE | Attending: Otolaryngology | Primary: Internal Medicine

## 2024-07-17 ENCOUNTER — Encounter: Admit: 2024-07-17 | Payer: PRIVATE HEALTH INSURANCE | Attending: Internal Medicine | Primary: Internal Medicine

## 2024-07-17 ENCOUNTER — Ambulatory Visit: Admit: 2024-07-17 | Payer: Medicare (Managed Care) | Attending: Otolaryngology | Primary: Internal Medicine

## 2024-07-17 VITALS — BP 152/86 | HR 87 | Temp 97.00000°F | Wt 125.7 lb

## 2024-07-17 DIAGNOSIS — E041 Nontoxic single thyroid nodule: Secondary | ICD-10-CM

## 2024-07-17 DIAGNOSIS — D333 Benign neoplasm of cranial nerves: Secondary | ICD-10-CM

## 2024-07-17 DIAGNOSIS — I10 Essential (primary) hypertension: Secondary | ICD-10-CM

## 2024-07-17 DIAGNOSIS — Z9889 Other specified postprocedural states: Secondary | ICD-10-CM

## 2024-07-17 DIAGNOSIS — Z8711 Personal history of peptic ulcer disease: Principal | ICD-10-CM

## 2024-07-17 DIAGNOSIS — E236 Other disorders of pituitary gland: Secondary | ICD-10-CM

## 2024-07-17 DIAGNOSIS — R7303 Prediabetes: Secondary | ICD-10-CM

## 2024-07-17 DIAGNOSIS — E785 Hyperlipidemia, unspecified: Secondary | ICD-10-CM

## 2024-07-17 DIAGNOSIS — M810 Age-related osteoporosis without current pathological fracture: Secondary | ICD-10-CM

## 2024-07-17 NOTE — Progress Notes
 The following is the transcribed Review of Systems completed by the patient at intake.Review of Systems HENT:        Acoustic neuroma All other systems reviewed and are negative.

## 2024-07-17 NOTE — Progress Notes
 [facility] OF MEDICINE						Department of SurgeryDivision of Otolaryngology - Head and Neck SurgeryPatient Name: Raven Allen of Birth: 1951/03/25 Date of Visit: 10/1/25The patient was referred by Raven DELENA Land, MD  with a chief complaint of: right vestibular schwannoma History of Present Illness:  Ms. Raven Allen is a 73 y.o. female who is seen   right acoustic neuroma  Had vertigo in 2022   No further imbalance.  No recent vertigo  No tinnitus  No facial twitch  or numbnesss Past Medical History:  Past Medical History[1] Past Surgical History:  Past Surgical History[2] Allergies:  Allergies as of 07/17/2024 - Review Complete 04/01/2024 Allergen Reaction Noted  Pravastatin  Other (See Comments) 01/17/2022  Vicodin [hydrocodone-acetaminophen] Syncope 01/06/2014 Medications:  Current Outpatient Medications Medication Sig  amLODIPine  Take 1.5 tablets (7.5 mg total) by mouth daily.  b complex vitamins Take 1 capsule by mouth daily.  calcium carbonate Take 1 tablet (600 mg total) by mouth 2 (two) times daily with breakfast and dinner.  Vitamin D3 Take 1 capsule (1,000 Units total) by mouth daily.  Co Q-10 Take 1 capsule (100 mg total) by mouth daily.  GLUCOSAMINE HCL/CHONDR SU A NA (OSTEO BI-FLEX ORAL) Take by mouth.  multivitamin Take 1 capsule by mouth daily.  rosuvastatin  TAKE ONE TABLET BY MOUTH TWICE WEEKLY No current facility-administered medications for this visit.  Review of Systems:  A 24-point review of systems was taken and noted by clinical support staff. All systems were negative/normal except for what is noted in the HPI and/or below by way of patient intake questionaire.Physical Examination:  Vital Signs: 	LMP  (LMP Unknown)  Otolaryngic Examination:General: 	Healthy appearing; alert and oriented.Head:		Normal cephalic Sinus:		Non tender, no purulenceSalivary glands: No massesFacial nerve:	Symmetrical Right Ear: 	Tympanic membrane: Normal; external auditory canal: Normal; auricle: Normal.Left Ear: 	Tympanic membrane: Normal; external auditory canal: Normal; auricle: Normal.Hearing:             MidlineEyes: 		Globes normal; extraocular muscles intact. No nystagmusNose: 		External nose normal; nasal airway clear.OC/OP: 	Normal mucosa; no lesions.Neck: 		No lymphadenopathy.Balance Examination: Romberg:	Normal. 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 female with right vestibular schwannomaPlan:  I have explained to the patient the natural history of a vestibular schwannoma with the usual slow growth rate and associated effect on hearing and balance.  I have also reviewed with her options regarding management of the tumor which would include observation, surgical removal and stereotactic radiosurgery.  We did review a retrosigmoid or a middle fossa approach, but I have explained to her the risks and benefits of this procedure. Based upon the size of the growth and its effect on her at this time, I have recommended that she continue to follow up with Dr. Land for audiometric testing and a scan again in 1 year.Rr:Wnmp, Vinie, MD Raven Allen F. Breanah Faddis, MD, FACSYale Otolaryngology - Head and Neck SurgerySection Chief, Otology, Neurotology and [facility] Surgery [1] Past Medical History:Diagnosis Date  Empty sella [facility] Code) [facility] CODE)   12/22 endo appt advised  H/O colonoscopy   7/24 SSP/TA; recall 7/29  History of peptic ulcer   Hyperlipidemia   Hypertension   6/24 nl EF; nl ETT  Osteoporosis   Dr. Thalia; 2/20 T -2.9; T -1.7 11/17; T -2.1 11/15  Prediabetes   Thyroid nodule  Dr. Thalia [2] Past Surgical History:Procedure Laterality Date  Arthroscopic surgery of the right knee    CATARACT EXTRACTION    Cesarean section (x1)    COLONOSCOPY  04/27/2023  serrated polyp / 5 yr recall  History of thyroid biopsy (had an FNA biopsy in 2008 and 2011, unremarkable)    SHOULDER SURGERY Right   TONSILLECTOMY    UPPER GASTROINTESTINAL ENDOSCOPY  10/30/2006

## 2024-07-24 ENCOUNTER — Encounter: Admit: 2024-07-24 | Payer: PRIVATE HEALTH INSURANCE | Primary: Internal Medicine

## 2024-07-24 ENCOUNTER — Ambulatory Visit: Admit: 2024-07-24 | Payer: Medicare (Managed Care) | Attending: Internal Medicine | Primary: Internal Medicine

## 2024-07-24 ENCOUNTER — Encounter: Admit: 2024-07-24 | Payer: PRIVATE HEALTH INSURANCE | Attending: Internal Medicine | Primary: Internal Medicine

## 2024-07-24 VITALS — BP 146/74 | HR 64 | Temp 98.90000°F | Wt 130.0 lb

## 2024-07-24 DIAGNOSIS — D333 Benign neoplasm of cranial nerves: Secondary | ICD-10-CM

## 2024-07-24 DIAGNOSIS — R609 Edema, unspecified: Principal | ICD-10-CM

## 2024-07-24 DIAGNOSIS — M810 Age-related osteoporosis without current pathological fracture: Secondary | ICD-10-CM

## 2024-07-24 DIAGNOSIS — Z9889 Other specified postprocedural states: Secondary | ICD-10-CM

## 2024-07-24 DIAGNOSIS — E785 Hyperlipidemia, unspecified: Secondary | ICD-10-CM

## 2024-07-24 DIAGNOSIS — Z8711 Personal history of peptic ulcer disease: Principal | ICD-10-CM

## 2024-07-24 DIAGNOSIS — R7303 Prediabetes: Secondary | ICD-10-CM

## 2024-07-24 DIAGNOSIS — E041 Nontoxic single thyroid nodule: Secondary | ICD-10-CM

## 2024-07-24 DIAGNOSIS — I10 Essential (primary) hypertension: Secondary | ICD-10-CM

## 2024-07-24 DIAGNOSIS — E236 Other disorders of pituitary gland: Secondary | ICD-10-CM

## 2024-07-24 MED ORDER — HYDROCHLOROTHIAZIDE 12.5 MG TABLET
12.5 | ORAL_TABLET | Freq: Every day | ORAL | 2 refills | 30.00000 days | Status: AC
Start: 2024-07-24 — End: ?

## 2024-07-24 NOTE — Telephone Encounter
 Nurse Triage Raven Allen reports intermittent left leg swelling for a few months.She has been experiencing swelling in her left leg since mid-August, persisting for almost two months. The swelling is moderate, extending from the middle of the leg down to the ankle, and improves with rest overnight but recurs during the day. 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 Patient is able to ambulate normally, denying any pain in her leg. Patient denies any fever, SOB/CP, warmth, redness, pain, or any other symptoms.Callback instructions and ED precautions reviewed with patient. Care advice discussed, discussed signs and symptoms that would require a higher level of care. Patient verbalizes understanding.DispositionPatient triaged using Schmitt-Thompson, per protocol patient advised to be seen today, appointment scheduledCare advice provided to the patient. Advised the patient to call back with any new or worsening symptoms. Patient verbalizes understanding.Escalation required: NoReason for Disposition Thigh, calf, or ankle swelling in only one legProtocols used: Leg Swelling and Edema-Adult-OH

## 2024-07-25 ENCOUNTER — Inpatient Hospital Stay: Admit: 2024-07-25 | Discharge: 2024-07-25 | Payer: Medicare (Managed Care) | Primary: Internal Medicine

## 2024-07-25 ENCOUNTER — Encounter: Admit: 2024-07-25 | Payer: PRIVATE HEALTH INSURANCE | Attending: Internal Medicine | Primary: Internal Medicine

## 2024-07-25 DIAGNOSIS — R609 Edema, unspecified: Principal | ICD-10-CM

## 2024-07-25 DIAGNOSIS — I10 Essential (primary) hypertension: Secondary | ICD-10-CM

## 2024-07-25 LAB — BUN/CREATININE/EGFR     (BH GH LMW Q YH)
BKR BLOOD UREA NITROGEN: 18 mg/dL (ref 8–23)
BKR BUN / CREAT RATIO: 23.7 — ABNORMAL HIGH (ref 8.0–23.0)
BKR CREATININE DELTA: 0.08
BKR CREATININE: 0.76 mg/dL (ref 0.40–1.30)
BKR EGFR, CREATININE (CKD-EPI 2021): >60 mL/min/1.73m2 (ref >=60)

## 2024-07-25 LAB — TSH W/REFLEX TO FT4     (BH GH LMW Q YH): BKR THYROID STIMULATING HORMONE: 2.13 u[IU]/mL

## 2024-07-25 LAB — PROTEIN, TOTAL W/CREATININE, URINE, RANDOM     (BH GH LMW YH)
BKR CREATININE, URINE, RANDOM: 59 mg/dL
BKR PROTEIN URINE RANDOM: 0.05 g/L
BKR PROTEIN/CREATININE RATIO, URINE, RANDOM: 0.09 mg/mg{creat} (ref <=0.10)

## 2024-07-25 LAB — HEPATIC FUNCTION PANEL
BKR A/G RATIO: 1.6 (ref 1.0–2.2)
BKR ALANINE AMINOTRANSFERASE (ALT): 19 U/L (ref 10–35)
BKR ALBUMIN: 4.2 g/dL (ref 3.6–5.1)
BKR ALKALINE PHOSPHATASE: 56 U/L (ref 9–122)
BKR ASPARTATE AMINOTRANSFERASE (AST): 23 U/L (ref 10–35)
BKR AST/ALT RATIO: 1.2
BKR BILIRUBIN DIRECT: 0.1 mg/dL (ref <=0.2)
BKR BILIRUBIN TOTAL: 0.4 mg/dL (ref <=1.2)
BKR GLOBULIN: 2.6 g/dL (ref 2.0–3.9)
BKR PROTEIN TOTAL: 6.8 g/dL (ref 5.9–8.3)

## 2024-07-25 LAB — CBC WITH AUTO DIFFERENTIAL
BKR WAM ABSOLUTE IMMATURE GRANULOCYTES.: 0.01 x 1000/ÂµL (ref 0.00–0.30)
BKR WAM ABSOLUTE LYMPHOCYTE COUNT.: 1.6 x 1000/ÂµL (ref 0.60–3.70)
BKR WAM ABSOLUTE NRBC: 0 x 1000/ÂµL (ref 0.00–1.00)
BKR WAM ANC (ABSOLUTE NEUTROPHIL COUNT): 3.05 x 1000/ÂµL (ref 2.00–7.60)
BKR WAM BASOPHIL ABSOLUTE COUNT.: 0.04 x 1000/ÂµL (ref 0.00–1.00)
BKR WAM BASOPHILS: 0.8 % (ref 0.0–1.4)
BKR WAM EOSINOPHIL ABSOLUTE COUNT.: 0.15 x 1000/ÂµL (ref 0.00–1.00)
BKR WAM EOSINOPHILS: 2.8 % (ref 0.0–5.0)
BKR WAM HEMATOCRIT: 43.4 % (ref 35.00–45.00)
BKR WAM HEMOGLOBIN: 13.7 g/dL (ref 11.7–15.5)
BKR WAM IMMATURE GRANULOCYTES: 0.2 % (ref 0.0–1.0)
BKR WAM LYMPHOCYTES: 30.4 % (ref 17.0–50.0)
BKR WAM MCH: 30.6 pg (ref 27.0–33.0)
BKR WAM MCHC: 31.6 g/dL (ref 31.0–36.0)
BKR WAM MCV: 97.1 fL (ref 80.0–100.0)
BKR WAM MONOCYTE ABSOLUTE COUNT.: 0.42 x 1000/ÂµL (ref 0.00–1.00)
BKR WAM MONOCYTES: 8 % (ref 4.0–12.0)
BKR WAM MPV: 10.9 fL (ref 8.0–12.0)
BKR WAM NEUTROPHILS: 57.8 % (ref 39.0–72.0)
BKR WAM NUCLEATED RED BLOOD CELLS: 0 % (ref 0.0–1.0)
BKR WAM PLATELETS: 213 x1000/ÂµL (ref 150–420)
BKR WAM RDW-CV: 13.5 % (ref 11.0–15.0)
BKR WAM RED BLOOD CELL COUNT.: 4.47 M/ÂµL (ref 4.00–6.00)
BKR WAM WHITE BLOOD CELL COUNT: 5.3 x1000/ÂµL (ref 4.0–11.0)

## 2024-07-25 LAB — ELECTROLYTE PANEL
BKR ANION GAP: 11 (ref 7–17)
BKR CHLORIDE: 103 mmol/L (ref 98–107)
BKR CO2: 25 mmol/L (ref 20–30)
BKR POTASSIUM: 4.1 mmol/L (ref 3.3–5.3)
BKR SODIUM: 139 mmol/L (ref 136–144)

## 2024-07-25 LAB — NT-PROBNPE: BKR B-TYPE NATRIURETIC PEPTIDE, PRO (PROBNP): 106.5 pg/mL (ref <125.0)

## 2024-07-26 ENCOUNTER — Inpatient Hospital Stay: Admit: 2024-07-26 | Discharge: 2024-07-26 | Payer: Medicare (Managed Care) | Primary: Internal Medicine

## 2024-07-26 DIAGNOSIS — R609 Edema, unspecified: Principal | ICD-10-CM

## 2024-08-14 ENCOUNTER — Inpatient Hospital Stay: Admit: 2024-08-14 | Discharge: 2024-08-14 | Payer: Medicare (Managed Care) | Primary: Internal Medicine

## 2024-08-14 ENCOUNTER — Encounter: Admit: 2024-08-14 | Payer: PRIVATE HEALTH INSURANCE | Attending: Family | Primary: Internal Medicine

## 2024-08-14 VITALS — BP 126/70 | HR 74 | Temp 97.00000°F | Resp 18 | Ht 59.0 in | Wt 129.0 lb

## 2024-08-14 DIAGNOSIS — I10 Essential (primary) hypertension: Secondary | ICD-10-CM

## 2024-08-14 DIAGNOSIS — E785 Hyperlipidemia, unspecified: Secondary | ICD-10-CM

## 2024-08-14 DIAGNOSIS — Z9889 Other specified postprocedural states: Secondary | ICD-10-CM

## 2024-08-14 DIAGNOSIS — E236 Other disorders of pituitary gland: Secondary | ICD-10-CM

## 2024-08-14 DIAGNOSIS — R7303 Prediabetes: Secondary | ICD-10-CM

## 2024-08-14 DIAGNOSIS — D333 Benign neoplasm of cranial nerves: Secondary | ICD-10-CM

## 2024-08-14 DIAGNOSIS — E041 Nontoxic single thyroid nodule: Secondary | ICD-10-CM

## 2024-08-14 DIAGNOSIS — M810 Age-related osteoporosis without current pathological fracture: Secondary | ICD-10-CM

## 2024-08-14 DIAGNOSIS — Z8711 Personal history of peptic ulcer disease: Principal | ICD-10-CM

## 2024-08-14 LAB — ELECTROLYTE PANEL
BKR ANION GAP: 11 (ref 7–17)
BKR CHLORIDE: 100 mmol/L (ref 98–107)
BKR CO2: 28 mmol/L (ref 20–30)
BKR POTASSIUM: 4 mmol/L (ref 3.3–5.3)
BKR SODIUM: 139 mmol/L (ref 136–144)

## 2024-08-14 LAB — BUN/CREATININE/EGFR     (BH GH LMW Q YH)
BKR BLOOD UREA NITROGEN: 19 mg/dL (ref 8–23)
BKR BUN / CREAT RATIO: 27.5 — ABNORMAL HIGH (ref 8.0–23.0)
BKR CREATININE DELTA: -0.07
BKR CREATININE: 0.69 mg/dL (ref 0.40–1.30)
BKR EGFR, CREATININE (CKD-EPI 2021): >60 mL/min/1.73m2 (ref >=60)

## 2024-08-14 LAB — MAGNESIUM: BKR MAGNESIUM: 2 mg/dL (ref 1.7–2.4)

## 2024-08-14 MED ORDER — OLMESARTAN 20 MG TABLET
20 | ORAL_TABLET | Freq: Every day | ORAL | 2 refills | 60.00000 days | Status: AC
Start: 2024-08-14 — End: ?

## 2024-08-20 ENCOUNTER — Encounter: Admit: 2024-08-20 | Payer: PRIVATE HEALTH INSURANCE | Attending: Family | Primary: Internal Medicine

## 2024-08-20 VITALS — BP 122/68 | HR 77 | Temp 98.00000°F | Ht 59.0 in

## 2024-08-20 DIAGNOSIS — E236 Other disorders of pituitary gland: Secondary | ICD-10-CM

## 2024-08-20 DIAGNOSIS — E041 Nontoxic single thyroid nodule: Secondary | ICD-10-CM

## 2024-08-20 DIAGNOSIS — E785 Hyperlipidemia, unspecified: Secondary | ICD-10-CM

## 2024-08-20 DIAGNOSIS — Z9889 Other specified postprocedural states: Secondary | ICD-10-CM

## 2024-08-20 DIAGNOSIS — D333 Benign neoplasm of cranial nerves: Secondary | ICD-10-CM

## 2024-08-20 DIAGNOSIS — M810 Age-related osteoporosis without current pathological fracture: Secondary | ICD-10-CM

## 2024-08-20 DIAGNOSIS — Z8711 Personal history of peptic ulcer disease: Principal | ICD-10-CM

## 2024-08-20 DIAGNOSIS — M7989 Other specified soft tissue disorders: Principal | ICD-10-CM

## 2024-08-20 DIAGNOSIS — R7303 Prediabetes: Secondary | ICD-10-CM

## 2024-08-20 DIAGNOSIS — I10 Essential (primary) hypertension: Secondary | ICD-10-CM

## 2024-08-20 NOTE — Patient Instructions [37]
 Raven Allen, today we measured your legs so you can purchase compression stockings/socks. You will need the level of compression, the length and your specific leg measurements to order online or buy in-store. This is very important. Otherwise, the compression will be ineffective. The level of compression we recommend for you is 20-30 mmHg and the length should be calf or knee high (it should reach just below your knee cap or two finger breaths below the back of your knee). Your measurements are below. If ordering online, we recommend [employer] as they have the most options and best prices. While on amazon.com, search for length/level of compression (for example: ?calf high 20-30 mmHg compression?). Each item should have a sizing guide chart. Use this to choose the correct size according to your measurements. This is very important so that the stockings are effective. The measurements may translate to a letter size like ?A/B/C, etc.? or traditional sizing like ?S/M/L/XL, etc.?:Right Leg Baseline Measurements   Left Leg Baseline Measurements Ankle      9.25 in/23.5 cm   Ankle         10 in/25.5 cm Calf      14.25 in/36.25 cm   Calf        14.25 in/36.25 cm Knee                     cm   Knee                     cm Thigh                     cm   Thigh                     cm Suprapubic (Hip) *                     cm   Suprapubic (Hip)*                     cm Umbilicus (Waist)*                     cm   Umbilicus (Waist)*                     cm -Length of the back of your lower leg: 14.5 inches or 36.75 centimeters*Conversions are approximate to make it easier to find your size on the sizing chart when ordering online.The stockings should rest just below your knee caps to avoid cutting off your circulation, which would cause swelling. It is recommended that you put these on first thing in the morning and take them off at the end of your day. Do NOT wear them at night. Try to be as consistent as possible to see improvements. We recommend wearing them consistently for at least 4 weeks to truly see/feel a difference. If you do not feel improvement, please call us  at [phone].*When cleaning, it is okay to wash them in the washing machine. Just don't put them in the dryer. AIR DRY ONLY.

## 2024-08-21 NOTE — Progress Notes [1]
 Division of Vascular Surgery and Endovascular TherapyReferring Provider: Vinie Amas, MD HPI: Raven Allen is a 73 y.o. female who presents for her initial vascular visit. Since August, she experiences swelling predominantly in the left leg, described as feeling like a balloon stretching. The swelling reduces after sleeping but persists otherwise. It is accompanied by warmth and hardness, without achiness, heaviness, fatigue, or pain.She was on amlodipine  for hypertension, which was discontinued due to swelling concerns and she was transitioned to an alternative antihypertensive agent.Compression stockings were attempted but found uncomfortable and too tight during periods of significant swelling. At today's visit the pt is doing well and she states that the edema has significantly subsided. Symptoms: L>RSwelling Yes Throbbing No Cramping No Aching No Heaviness Yes Leg Fatigue No Pelvic Pain, Heavy Menses, or Dyspareunia Not ApplicableCompression Stocking Use No Past Medical History[1] Hx of VTE No Past Surgical History[2] Social History Socioeconomic History  Marital status: Married   Spouse name: Not on file  Number of children: Not on file  Years of education: Not on file  Highest education level: Not on file Occupational History  Not on file Tobacco Use  Smoking status: Never  Smokeless tobacco: Never Substance and Sexual Activity  Alcohol use: Not Currently   Comment: occasional glass of wine  Drug use: No  Sexual activity: Not on file Other Topics Concern  Not on file Social History Narrative  Not on file Social Drivers of Health Financial Resource Strain: Not on file Food Insecurity: Not on file Transportation Needs: Not on file Physical Activity: Not on file Stress: Not on file Social Connections: Not on file Intimate Partner Violence: Not on file Housing Stability: Not on file Family History Problem Relation Age of Onset  Osteoporosis Mother   Other (data conversion) Maternal Cousin       Siblings (4) history of hyperlipidemia 1 brother with MS/daughter with MS/Family history of diabetes mellitus Sister with DM/Family history of hyperlipidemia strong family history/Father health status was reviewed died at 91, MI/Mother health status was reviewed Living with osteoporosis and hypercholesterolemia  Coronary Artery Disease Father   Coronary Artery Disease Brother   Hemochromatosis Brother   Diabetes Sister   Medications Ordered Prior to Encounter[3]Allergies[4]Vitals:  08/20/24 1452 BP: 122/68 Pulse: 77 Temp: 98 ?F (36.7 ?C)  Review of Systems -Cardiac: Chest Pain No Respiratory: Shortness of Breath No Neuro: Symptoms suggestive of Stroke or TIA No Review: Pertinent items are noted in HPI.Physical Exam: General: Oriented to person, place, and time, Not in acute distressNeuro: Normal affect, No neurological deficitRespiratory: Normal breath sounds bilateral, No wheezingCardiac: Normal rate, regular rhythm, Normal heart soundsExtremities: Edema Yes L>RHyperpigmentation over the shins No Erythema No Varicose veins No Telangectasia No Ulcer No Venous Clinical Severity ScorePain:	0 = NoneVaricose Veins:	0 = NoneVenous Edema:	3 = Morning swelling above ankle and requiring activity change, elevationSkin Pigmentation:	0 = NoneInflammation: 	0 = NoneInduration:	0 = NoneNumber of Active Ulcers:	0 = 0Active Ulcer Duration:	0 = NoneActive Ulcer Diameter: 	0 = None Compression Therapy: 	1 = Intermittent use of stockingsScore:	4Pulses: R radial +2 L radial +2         R posterior tibial +2 L posterior tibial +2 R dorsalis pedis +2 L dorsalis pedis +2 LabsLab Results Component Value Date  CREATININE 0.69 08/14/2024 Lab Results Component Value Date  K 4.0 08/14/2024 Lab Results Component Value Date  WBC 5.3 07/25/2024  HGB 13.7 07/25/2024  HCT 43.40 07/25/2024  PLT 213 07/25/2024 Lab Results Component Value Date  INR 1.05 10/01/2021 No results found for: PREALBUMINLab Results Component  Value Date  ALBUMIN 4.2 07/25/2024 Diagnostics / Imaging: US  Duplex Lower Extremity Venous BilatResult Date: 10/10/2025US DUPLEX LOWER EXTREMITY VENOUS BILATERAL. HISTORY: B LE edema. COMPARISON: NONE. TECHNIQUE: Grayscale, color and pulsed Doppler imaging were performed.  FINDINGS: There is symmetric phasicity in the lower external iliac veins. RIGHT LOWER EXTREMITY: COMMON FEMORAL VEIN: No thrombus. GREAT SAPHENOUS ORIGIN: No thrombus. UPPER PROFUNDA FEMORAL VEIN: No thrombus. FEMORAL VEIN: No thrombus. POPLITEAL VEIN: No thrombus. Appropriate augmentation to flow is noted in the popliteal vein. TIBIOPERONEAL TRUNK: No thrombus. POSTERIOR TIBIAL VEINS: No thrombus.  PERONEAL VEINS: Not well visualized.  LIMITATIONS: Some vessels could not be fully compressed, limiting evaluation for nonocclusive thrombus. LEFT LOWER EXTREMITY: COMMON FEMORAL VEIN: No thrombus. GREAT SAPHENOUS ORIGIN: No thrombus. UPPER PROFUNDA FEMORAL VEIN: No thrombus. FEMORAL VEIN: No thrombus. POPLITEAL VEIN: No thrombus. Appropriate augmentation to flow is noted in the popliteal vein. TIBIOPERONEAL TRUNK: No thrombus. POSTERIOR TIBIAL VEINS: No thrombus.  PERONEAL VEINS: Not well visualized.   LIMITATIONS: All vessels could not be fully compressed, limiting evaluation for nonocclusive thrombus. A small Baker's cyst is seen in the right popliteal fossa measuring up to 2.6 cm craniocaudally.  No evidence of deep venous thrombosis in bilateral lower extremities. Small Baker's cyst on the right. Please note that ultrasound is less sensitive for evaluation of thrombus in calf veins. If there is continued clinical concern, re-evaluation can be performed after 5-7 days to evaluate for propagation of an unseen thrombus from the calf. [facility] Notify System Classification: Routine. Report initiated by: Ivey Ferris, MD Reported and signed by: Jay Pahade, MD  No results found. Impression / Assessment / Plan:Raven Allen is a 74 y.o. female with hs of LE edema which started in August, L>R. The pt was transitioned from amlodipine  to an alternative antihypertensive and notes that the edema is slowly resolving. Elevation helps with symptoms, she denies LE pain, skin breakdown and aching. At today's visit the pt was fitted for compression, discussed possibility of venous insufficiency, however given the regression of symptoms and her busy life she will defer testing for several weeks, employ conservative management and RTC for US  only if the symptom persists or worsens. Left Leg SwellingChronic left leg swelling reduced after discontinuing amlodipine . Differential includes venous insufficiency. Previous ultrasound negative for clots. Conservative management recommended.- Provide compression stocking information and measure for correct size.- Advise wearing compression stockings during the day and elevating legs when possible.- Schedule an ultrasound in 4-5 weeks to assess venous function.- Will cancel ultrasound and appointment if swelling resolves completely and symptoms do not return.Schedule for Ablation: NoPatient is a: Non-Smoker [employer], APRNNovember 5, 2025 [1] Past Medical History:Diagnosis Date  Empty sella (HC Code)   12/22 endo appt advised  H/O colonoscopy   7/24 SSP/TA; recall 7/29  History of peptic ulcer   Hyperlipidemia   Hypertension   6/24 nl EF; nl ETT  Osteoporosis   Dr. Thalia; 2/20 T -2.9; T -1.7 11/17; T -2.1 11/15  Prediabetes   Thyroid nodule   Dr. Thalia  Vestibular schwannoma [facility] Code)  [facility] CODE)   9/25 Dr. Kevton [2] Past Surgical History:Procedure Laterality Date  Arthroscopic surgery of the right knee    CATARACT EXTRACTION    Cesarean section (x1)    COLONOSCOPY  04/27/2023  serrated polyp / 5 yr recall  History of thyroid biopsy (had an FNA biopsy in 2008 and 2011, unremarkable)    SHOULDER SURGERY Right   TONSILLECTOMY    UPPER GASTROINTESTINAL ENDOSCOPY  10/30/2006 [3] Current Outpatient Medications on File Prior to Visit Medication Sig Dispense Refill  b complex vitamins capsule Take 1 capsule by mouth daily.    calcium carbonate (OS-CAL) 600 mg (1,500 mg) Tab tablet Take 1 tablet (600 mg total) by mouth 2 (two) times daily with breakfast and dinner.    cholecalciferol, vitamin D3, (VITAMIN D3) 1,000 unit Cap Take 1 capsule (1,000 Units total) by mouth daily.    co-enzyme Q-10 (CO Q-10) 100 mg Cap Take 1 capsule (100 mg total) by mouth daily.    GLUCOSAMINE HCL/CHONDR SU A NA (OSTEO BI-FLEX ORAL) Take by mouth.    multivitamin capsule Take 1 capsule by mouth daily.    olmesartan (BENICAR) 20 mg tablet Take 1 tablet (20 mg total) by mouth daily. 30 tablet 1  rosuvastatin  (CRESTOR ) 5 mg tablet TAKE ONE TABLET BY MOUTH TWICE WEEKLY 24 tablet 1  hydroCHLOROthiazide 12.5 mg tablet Take 1 tablet (12.5 mg total) by mouth daily. (Patient not taking: Reported on 08/20/2024) 30 tablet 1 No current facility-administered medications on file prior to visit. [4] AllergiesAllergen Reactions  Pravastatin  Other (See Comments)   1/23 flank pain  Vicodin [Hydrocodone-Acetaminophen] Syncope

## 2024-08-26 ENCOUNTER — Encounter: Admit: 2024-08-26 | Payer: PRIVATE HEALTH INSURANCE | Attending: Family | Primary: Internal Medicine

## 2024-08-26 ENCOUNTER — Ambulatory Visit: Admit: 2024-08-26 | Payer: Medicare (Managed Care) | Attending: Family | Primary: Internal Medicine

## 2024-08-26 ENCOUNTER — Inpatient Hospital Stay: Admit: 2024-08-26 | Discharge: 2024-08-26 | Payer: Medicare (Managed Care) | Primary: Internal Medicine

## 2024-08-26 VITALS — BP 124/76 | HR 70 | Resp 18 | Ht 59.0 in | Wt 127.0 lb

## 2024-08-26 DIAGNOSIS — I10 Essential (primary) hypertension: Principal | ICD-10-CM

## 2024-08-26 DIAGNOSIS — R7303 Prediabetes: Secondary | ICD-10-CM

## 2024-08-26 DIAGNOSIS — Z9889 Other specified postprocedural states: Secondary | ICD-10-CM

## 2024-08-26 DIAGNOSIS — E785 Hyperlipidemia, unspecified: Secondary | ICD-10-CM

## 2024-08-26 DIAGNOSIS — E041 Nontoxic single thyroid nodule: Secondary | ICD-10-CM

## 2024-08-26 DIAGNOSIS — D333 Benign neoplasm of cranial nerves: Secondary | ICD-10-CM

## 2024-08-26 DIAGNOSIS — Z8711 Personal history of peptic ulcer disease: Principal | ICD-10-CM

## 2024-08-26 DIAGNOSIS — M810 Age-related osteoporosis without current pathological fracture: Secondary | ICD-10-CM

## 2024-08-26 DIAGNOSIS — E236 Other disorders of pituitary gland: Secondary | ICD-10-CM

## 2024-08-26 LAB — BASIC METABOLIC PANEL
BKR ANION GAP: 9 (ref 7–17)
BKR BLOOD UREA NITROGEN: 17 mg/dL (ref 8–23)
BKR BUN / CREAT RATIO: 25 — ABNORMAL HIGH (ref 8.0–23.0)
BKR CALCIUM: 10 mg/dL (ref 8.8–10.2)
BKR CHLORIDE: 102 mmol/L (ref 98–107)
BKR CO2: 26 mmol/L (ref 20–30)
BKR CREATININE DELTA: -0.01
BKR CREATININE: 0.68 mg/dL (ref 0.40–1.30)
BKR EGFR, CREATININE (CKD-EPI 2021): >60 mL/min/1.73m2 (ref >=60)
BKR GLUCOSE: 102 mg/dL — ABNORMAL HIGH (ref 70–100)
BKR POTASSIUM: 4.5 mmol/L (ref 3.3–5.3)
BKR SODIUM: 137 mmol/L (ref 136–144)

## 2024-08-29 ENCOUNTER — Encounter: Admit: 2024-08-29 | Payer: PRIVATE HEALTH INSURANCE | Primary: Internal Medicine

## 2024-08-29 VITALS — BP 130/80 | HR 71 | Ht 59.0 in | Wt 129.0 lb

## 2024-08-29 DIAGNOSIS — E041 Nontoxic single thyroid nodule: Secondary | ICD-10-CM

## 2024-08-29 DIAGNOSIS — M810 Age-related osteoporosis without current pathological fracture: Principal | ICD-10-CM

## 2024-08-29 DIAGNOSIS — E236 Other disorders of pituitary gland: Secondary | ICD-10-CM

## 2024-08-29 DIAGNOSIS — R7303 Prediabetes: Secondary | ICD-10-CM

## 2024-08-29 DIAGNOSIS — I10 Essential (primary) hypertension: Secondary | ICD-10-CM

## 2024-08-29 DIAGNOSIS — Z8711 Personal history of peptic ulcer disease: Principal | ICD-10-CM

## 2024-08-29 DIAGNOSIS — E785 Hyperlipidemia, unspecified: Secondary | ICD-10-CM

## 2024-08-29 DIAGNOSIS — Z9889 Other specified postprocedural states: Secondary | ICD-10-CM

## 2024-08-29 DIAGNOSIS — D333 Benign neoplasm of cranial nerves: Secondary | ICD-10-CM

## 2024-09-10 ENCOUNTER — Encounter: Admit: 2024-09-10 | Payer: PRIVATE HEALTH INSURANCE | Attending: Family | Primary: Internal Medicine

## 2024-09-10 VITALS — BP 120/68 | HR 66 | Temp 98.00000°F | Resp 18 | Wt 130.0 lb

## 2024-09-10 DIAGNOSIS — Z9889 Other specified postprocedural states: Secondary | ICD-10-CM

## 2024-09-10 DIAGNOSIS — E785 Hyperlipidemia, unspecified: Secondary | ICD-10-CM

## 2024-09-10 DIAGNOSIS — I10 Essential (primary) hypertension: Principal | ICD-10-CM

## 2024-09-10 DIAGNOSIS — M810 Age-related osteoporosis without current pathological fracture: Secondary | ICD-10-CM

## 2024-09-10 DIAGNOSIS — D333 Benign neoplasm of cranial nerves: Secondary | ICD-10-CM

## 2024-09-10 DIAGNOSIS — R609 Edema, unspecified: Secondary | ICD-10-CM

## 2024-09-10 DIAGNOSIS — E236 Other disorders of pituitary gland: Secondary | ICD-10-CM

## 2024-09-10 DIAGNOSIS — E041 Nontoxic single thyroid nodule: Secondary | ICD-10-CM

## 2024-09-10 DIAGNOSIS — R7303 Prediabetes: Secondary | ICD-10-CM

## 2024-09-10 DIAGNOSIS — Z8711 Personal history of peptic ulcer disease: Principal | ICD-10-CM

## 2024-09-10 MED ORDER — OLMESARTAN 5 MG TABLET
5 | ORAL_TABLET | ORAL | 2 refills | 90.00000 days | Status: AC
Start: 2024-09-10 — End: ?

## 2024-09-11 ENCOUNTER — Encounter: Admit: 2024-09-11 | Payer: PRIVATE HEALTH INSURANCE | Attending: Family | Primary: Internal Medicine

## 2024-09-11 ENCOUNTER — Inpatient Hospital Stay: Admit: 2024-09-11 | Discharge: 2024-09-11 | Payer: Medicare (Managed Care) | Primary: Internal Medicine

## 2024-09-11 DIAGNOSIS — E785 Hyperlipidemia, unspecified: Secondary | ICD-10-CM

## 2024-09-11 LAB — COMPREHENSIVE METABOLIC PANEL
BKR A/G RATIO: 1.9 (ref 1.0–2.2)
BKR ALANINE AMINOTRANSFERASE (ALT): 16 U/L (ref 10–35)
BKR ALBUMIN: 4.1 g/dL (ref 3.6–5.1)
BKR ALKALINE PHOSPHATASE: 51 U/L (ref 9–122)
BKR ANION GAP: 9 (ref 7–17)
BKR ASPARTATE AMINOTRANSFERASE (AST): 27 U/L (ref 10–35)
BKR AST/ALT RATIO: 1.7
BKR BILIRUBIN TOTAL: 0.6 mg/dL (ref <=1.2)
BKR BLOOD UREA NITROGEN: 16 mg/dL (ref 8–23)
BKR BUN / CREAT RATIO: 21.1 (ref 8.0–23.0)
BKR CALCIUM: 9.4 mg/dL (ref 8.8–10.2)
BKR CHLORIDE: 106 mmol/L (ref 98–107)
BKR CO2: 27 mmol/L (ref 20–30)
BKR CREATININE DELTA: 0.08
BKR CREATININE: 0.76 mg/dL (ref 0.40–1.30)
BKR EGFR, CREATININE (CKD-EPI 2021): >60 mL/min/1.73m2 (ref >=60)
BKR GLOBULIN: 2.2 g/dL (ref 2.0–3.9)
BKR GLUCOSE: 107 mg/dL — ABNORMAL HIGH (ref 70–100)
BKR POTASSIUM: 4.5 mmol/L (ref 3.3–5.3)
BKR PROTEIN TOTAL: 6.3 g/dL (ref 5.9–8.3)
BKR SODIUM: 142 mmol/L (ref 136–144)

## 2024-09-11 LAB — LIPID PANEL
BKR CHOLESTEROL/HDL RATIO: 2.7 (ref 0.0–5.0)
BKR CHOLESTEROL: 173 mg/dL
BKR HDL CHOLESTEROL: 65 mg/dL (ref >=40)
BKR LDL CHOLESTEROL SAMPSON CALCULATED: 96 mg/dL
BKR TRIGLYCERIDES: 61 mg/dL

## 2024-09-11 LAB — BILIRUBIN, DIRECT: BKR BILIRUBIN DIRECT: 0.1 mg/dL (ref <=0.2)

## 2024-09-30 ENCOUNTER — Encounter: Admit: 2024-09-30 | Payer: PRIVATE HEALTH INSURANCE | Primary: Internal Medicine

## 2024-10-03 ENCOUNTER — Inpatient Hospital Stay: Admit: 2024-10-03 | Discharge: 2024-10-03 | Payer: PRIVATE HEALTH INSURANCE | Primary: Internal Medicine

## 2024-10-03 DIAGNOSIS — M810 Age-related osteoporosis without current pathological fracture: Principal | ICD-10-CM

## 2024-10-07 ENCOUNTER — Ambulatory Visit: Admit: 2024-10-07 | Payer: PRIVATE HEALTH INSURANCE | Primary: Internal Medicine

## 2024-10-07 DIAGNOSIS — M7989 Other specified soft tissue disorders: Principal | ICD-10-CM

## 2024-10-08 ENCOUNTER — Encounter: Admit: 2024-10-08 | Payer: PRIVATE HEALTH INSURANCE | Attending: Family | Primary: Internal Medicine

## 2024-10-08 VITALS — BP 110/70 | HR 65 | Temp 98.00000°F

## 2024-10-08 DIAGNOSIS — M7989 Other specified soft tissue disorders: Principal | ICD-10-CM

## 2024-10-13 ENCOUNTER — Encounter: Admit: 2024-10-13 | Payer: PRIVATE HEALTH INSURANCE | Primary: Internal Medicine

## 2024-10-14 NOTE — Progress Notes [1]
 Raven Allen: Chief Complaint:Dx imaging of b/l LE'sHPI: Raven Allen is a 73 y.o. female patient who returns to vascular clinic today for dx imaging and in-person follow-up. The pt was seen initially on 08/20/24 with report of LE edema that started in August. The pt explained that the swelling reduces after sleeping, but persists otherwise. It is accompanied by warmth and hardness, without achiness, heaviness, fatigue, or pain. 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 She denies denies skin breakdown and is quite certain that many of her current symptoms are related to stress. Prior to today's visit she underwent a venous US  with DVT and reflux studies. Past Medical History[1]Past Surgical History[2]Medications Ordered Prior to Encounter[3]Allergies: Hydrochlorothiazide , Hydrocodone-acetaminophen, and PravastatinObjective: Physical Exam:Constitutional: No apparent distress. Pleasant and conversant.Vital Signs: BP 110/70  - Pulse 65  - Temp 98 ?F (36.7 ?C) (No-touch scanner)  - LMP  (LMP Unknown)  - SpO2 99%  Neuro:  A&O x4. Motor grossly intact without focal deficits Resp:  Breathing comfortably without audible wheezesMusculoskeletal: Ambulates independently, full ROM of b/l LE'sIntegumentary: warm and intactLower Extremities: No clubbing, cyanosis, gangrene. No venous stasis ulcer or edema. Vascular: Pulse exam from initial visit, b/l pedal pulses were easily palpable. 	Diagnostics: b/l LE venous US  with DVT and reflux studies was negative for both:ImpressionNo evidence of deep or superficial vein thrombosis in the bilateral lower extremities. No evidence of deep venous insufficiency.No evidence of superficial venous insufficiency. Read and reported by  Vinetta Carmela Rometta Linette, MD, MPH, MS, RPVIYale Division of Vascular Surgery and Endovascular Therapy  US  Duplex Lower Ext Venous Reflux Bilateral LTD ([facility])Order: [id number] Status: Final result   Dx: Leg swelling  Test Result Released: Yes (not seen)  0 Result NotesDetailsReading Physician Reading Date Result Priority Rometta Linette Vinetta Carmela LOISE, [phone]  10/11/2024 Routine Narrative & ImpressionName: Raynelle Fujikawa DOB: [birth year] MRN: [id number] Procedure Date: 10/07/2024 US  Duplex Lower Ext Venous Reflux Bilateral  FINDINGS: Normal compressibility without evidence of thrombus seen in the common femoral, femoral, popliteal, posterior tibial and peroneal veins.  The great and small saphenous veins are patent and compressible.  There is no Color fill defect and Doppler flow signals are phasic and spontaneous. No evidence of deep venous reflux.  No evidence of superficial venous reflux.        RIGHT LEFT  Diameter MM Reflux Time sec Diameter MM Reflux Time sec CFV   0.95   0 FV MID   0   0 POPV   0   0 SFJ 4.36 0.46 5.91 0 GSV THIGH PROX 2.29   4.56   GSV THIGH MID 2.29 0 3.63 0 GSV THIGH DIST 2.58   2.86   GSV KNEE 1.85 0 3.17 0 GSV CALF PROX 2.12   2.93   GSV CALF MID 1.85 0 1.85 0 GSV CALF DIST 1.35   2.31   SPJ/THIGH EX 3.60 0 3.66 0 SSV PROX 4.56   4.19   SSV MID 3.08 0 2.70 0 SSV DIST 2.69   2.69    IMPRESSION:No evidence of deep or superficial vein thrombosis in the bilateral lower extremities. No evidence of deep venous insufficiency.No evidence of superficial venous insufficiency. Read and reported by  Vinetta Carmela Rometta Linette, MD, MPH, MS, RPVIYale Division of Vascular Surgery and Endovascular Therapy   IMPRESSION/PLAN: Raven Allen is a 73 y.o. female patient with report of b/l LE  edema and warmth, today's US  is negative for DVT and negative for venous reflux. She is pleased with the news and will RTC on a PRN basis. Plan:	1. Continue use of compression stockings if they help diminish edema	2. Elevate LE's when possible 	3. RTC PRNCc:  Electronically Signed by Reyes Chalk, APRN, December 24, 2025[facility] of Medicine, Section of Vascular [address], [address] , [place] [address] [place], [place] [address]Nqqprz: [phone]: [phone] [1] Past Medical History:Diagnosis Date  Empty sella (HC Code)   12/22 endo appt advised  H/O colonoscopy   7/24 SSP/TA; recall 7/29  History of peptic ulcer   Hyperlipidemia   Hypertension   6/24 nl EF; nl ETT  Osteoporosis   Dr. Thalia; 2/20 T -2.9; T -1.7 11/17; T -2.1 11/15  Prediabetes   Thyroid nodule   Dr. Thalia  Vestibular schwannoma [facility] Code)  [facility] CODE)   9/25 Dr. Jena [2] Past Surgical History:Procedure Laterality Date  Arthroscopic surgery of the right knee    CATARACT EXTRACTION    Cesarean section (x1)    COLONOSCOPY  04/27/2023  serrated polyp / 5 yr recall  History of thyroid biopsy (had an FNA biopsy in 2008 and 2011, unremarkable)    SHOULDER SURGERY Right   TONSILLECTOMY    UPPER GASTROINTESTINAL ENDOSCOPY  10/30/2006 [3] Current Outpatient Medications on File Prior to Visit Medication Sig Dispense Refill  b complex vitamins capsule Take 1 capsule by mouth daily.    calcium carbonate (OS-CAL) 600 mg (1,500 mg) Tab tablet Take 1 tablet (600 mg total) by mouth 2 (two) times daily with breakfast and dinner.    cholecalciferol, vitamin D3, (VITAMIN D3) 1,000 unit Cap Take 1 capsule (1,000 Units total) by mouth daily.    co-enzyme Q-10 (CO Q-10) 100 mg Cap Take 1 capsule (100 mg total) by mouth daily.    GLUCOSAMINE HCL/CHONDR SU A NA (OSTEO BI-FLEX ORAL) Take by mouth.    multivitamin capsule Take 1 capsule by mouth daily.    olmesartan  (BENICAR ) 5 mg tablet Take 2 tabs (10mg ) a day 60 tablet 1 rosuvastatin  (CRESTOR ) 5 mg tablet TAKE ONE TABLET BY MOUTH TWICE WEEKLY 24 tablet 1 No current facility-administered medications on file prior to visit.

## 2024-12-09 ENCOUNTER — Encounter: Admit: 2024-12-09 | Payer: PRIVATE HEALTH INSURANCE | Attending: Family | Primary: Internal Medicine

## 2025-04-22 ENCOUNTER — Encounter: Admit: 2025-04-22 | Payer: PRIVATE HEALTH INSURANCE | Attending: Internal Medicine | Primary: Internal Medicine
